# Patient Record
Sex: Male | Born: 1970 | Race: White | Hispanic: No | Smoking: Current every day smoker
Health system: Southern US, Community
[De-identification: ages and names within clinical notes are randomized; demographics above are authoritative.]

## PROBLEM LIST (undated history)

## (undated) DIAGNOSIS — I10 Essential (primary) hypertension: Secondary | ICD-10-CM

## (undated) DIAGNOSIS — F32A Depression, unspecified: Secondary | ICD-10-CM

## (undated) DIAGNOSIS — F419 Anxiety disorder, unspecified: Secondary | ICD-10-CM

## (undated) DIAGNOSIS — F329 Major depressive disorder, single episode, unspecified: Secondary | ICD-10-CM

## (undated) HISTORY — DX: Major depressive disorder, single episode, unspecified: F32.9

## (undated) HISTORY — DX: Anxiety disorder, unspecified: F41.9

## (undated) HISTORY — DX: Depression, unspecified: F32.A

## (undated) HISTORY — PX: OTHER SURGICAL HISTORY: SHX169

## (undated) HISTORY — DX: Essential (primary) hypertension: I10

---

## 2010-09-29 ENCOUNTER — Ambulatory Visit: Payer: Self-pay | Admitting: "Endocrinology

## 2011-07-01 ENCOUNTER — Emergency Department: Payer: Self-pay | Admitting: Internal Medicine

## 2013-12-04 ENCOUNTER — Ambulatory Visit: Payer: Self-pay | Admitting: Internal Medicine

## 2013-12-04 VITALS — BP 122/88 | HR 119 | Temp 97.9°F | Resp 16 | Ht 65.0 in | Wt 171.2 lb

## 2013-12-04 DIAGNOSIS — F3289 Other specified depressive episodes: Secondary | ICD-10-CM

## 2013-12-04 DIAGNOSIS — F329 Major depressive disorder, single episode, unspecified: Secondary | ICD-10-CM

## 2013-12-04 DIAGNOSIS — F32A Depression, unspecified: Secondary | ICD-10-CM

## 2013-12-04 DIAGNOSIS — F1020 Alcohol dependence, uncomplicated: Secondary | ICD-10-CM | POA: Insufficient documentation

## 2013-12-04 MED ORDER — CITALOPRAM HYDROBROMIDE 20 MG PO TABS: 20.0000 mg | ORAL_TABLET | Freq: Every day | ORAL | Status: DC

## 2013-12-04 MED ORDER — TRAZODONE HCL 50 MG PO TABS: 25.0000 mg | ORAL_TABLET | Freq: Every evening | ORAL | Status: DC | PRN

## 2013-12-04 MED ORDER — CYCLOBENZAPRINE HCL 5 MG PO TABS: 5.0000 mg | ORAL_TABLET | Freq: Three times a day (TID) | ORAL | Status: DC | PRN

## 2013-12-04 MED ORDER — CHLORDIAZEPOXIDE HCL 10 MG PO CAPS: 10.0000 mg | ORAL_CAPSULE | Freq: Three times a day (TID) | ORAL | Status: DC | PRN
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
## 2013-12-04 NOTE — Patient Instructions (Addendum)
ETOH and drug services -  [address] - [phone] [address] [place] - [phone] or [phone] Det[facility] - [phone]   [facility] - for counseling [phone]   Start Celexa daily to help with depression Start Flexeril - it will help with your muscle spasms - take this if you need it for muscle spasms and only during the day if you are not sleepy Trazodone - start with 25mg  (1/2 pill) at night and you can increase every 3 days to allow you to sleep up to 100mg  at night (that is 2 pills) Librium 10mg  - start with 3x/day for 5 days and then decrease to 2/day if your symptoms are good Start a Vit b complex vitamin

## 2013-12-05 LAB — COMPLETE METABOLIC PANEL WITH GFR
ALBUMIN: 4.5 g/dL (ref 3.5–5.2)
ALK PHOS: 83 U/L (ref 39–117)
ALT: 21 U/L (ref 0–53)
AST: 23 U/L (ref 0–37)
BUN: 10 mg/dL (ref 6–23)
CO2: 29 mEq/L (ref 19–32)
Calcium: 9.9 mg/dL (ref 8.4–10.5)
Chloride: 105 mEq/L (ref 96–112)
Creat: 0.88 mg/dL (ref 0.50–1.35)
GLUCOSE: 101 mg/dL — AB (ref 70–99)
POTASSIUM: 4.9 meq/L (ref 3.5–5.3)
Sodium: 140 mEq/L (ref 135–145)
Total Bilirubin: 0.4 mg/dL (ref 0.2–1.2)
Total Protein: 7.2 g/dL (ref 6.0–8.3)

## 2013-12-07 ENCOUNTER — Encounter: Payer: Self-pay | Admitting: *Deleted

## 2014-01-08 ENCOUNTER — Emergency Department (HOSPITAL_COMMUNITY)
Admission: EM | Admit: 2014-01-08 | Discharge: 2014-01-08 | Discharge status: Against Medical Advice | Payer: Self-pay | Attending: Emergency Medicine | Admitting: Emergency Medicine

## 2014-01-08 ENCOUNTER — Encounter (HOSPITAL_COMMUNITY): Payer: Self-pay | Admitting: Emergency Medicine

## 2014-01-08 ENCOUNTER — Emergency Department (HOSPITAL_COMMUNITY): Payer: Self-pay

## 2014-01-08 ENCOUNTER — Emergency Department (HOSPITAL_COMMUNITY): Admission: EM | Admit: 2014-01-08 | Discharge: 2014-01-08 | Discharge status: Against Medical Advice | Payer: Self-pay

## 2014-01-08 DIAGNOSIS — M25522 Pain in left elbow: Secondary | ICD-10-CM

## 2014-01-08 DIAGNOSIS — I10 Essential (primary) hypertension: Secondary | ICD-10-CM | POA: Insufficient documentation

## 2014-01-08 DIAGNOSIS — F17200 Nicotine dependence, unspecified, uncomplicated: Secondary | ICD-10-CM | POA: Insufficient documentation

## 2014-01-08 DIAGNOSIS — M25529 Pain in unspecified elbow: Secondary | ICD-10-CM | POA: Insufficient documentation

## 2014-01-08 NOTE — ED Notes (Signed)
Patient states "Im not waiting any longer, Im leaving"

## 2014-01-08 NOTE — ED Notes (Signed)
Pt states elbow pain on left for a couple of days.  Worse this morning. Swelling noted.

## 2014-01-08 NOTE — ED Provider Notes (Signed)
Patient left without being seen after triage. This Janet Humphreys did not interview or assess the patient. This Ahkeem Goede did not partake in any medical decision making for the patient.   Raymon MuttonMarissa Sciacca, PA-C 01/08/14 2103

## 2014-01-09 ENCOUNTER — Ambulatory Visit (INDEPENDENT_AMBULATORY_CARE_PROVIDER_SITE_OTHER): Payer: Self-pay | Admitting: Family Medicine

## 2014-01-09 VITALS — BP 120/80 | HR 106 | Temp 98.2°F | Resp 16 | Ht 65.75 in | Wt 168.4 lb

## 2014-01-09 DIAGNOSIS — M25522 Pain in left elbow: Secondary | ICD-10-CM

## 2014-01-09 DIAGNOSIS — F3289 Other specified depressive episodes: Secondary | ICD-10-CM

## 2014-01-09 DIAGNOSIS — M7022 Olecranon bursitis, left elbow: Secondary | ICD-10-CM

## 2014-01-09 DIAGNOSIS — M25529 Pain in unspecified elbow: Secondary | ICD-10-CM

## 2014-01-09 DIAGNOSIS — F329 Major depressive disorder, single episode, unspecified: Secondary | ICD-10-CM

## 2014-01-09 DIAGNOSIS — M7020 Olecranon bursitis, unspecified elbow: Secondary | ICD-10-CM

## 2014-01-09 DIAGNOSIS — F32A Depression, unspecified: Secondary | ICD-10-CM

## 2014-01-09 MED ORDER — CITALOPRAM HYDROBROMIDE 20 MG PO TABS: 20.0000 mg | ORAL_TABLET | Freq: Every day | ORAL | Status: DC

## 2014-01-09 MED ORDER — TRAMADOL HCL 50 MG PO TABS
50.0000 mg | ORAL_TABLET | Freq: Two times a day (BID) | ORAL | Status: DC | PRN
Start: 2014-01-09 — End: 2014-01-22

## 2014-01-09 NOTE — Patient Instructions (Signed)
Olecranon Bursitis Bursitis is swelling and soreness (inflammation) of a fluid-filled sac (bursa) that covers and protects a joint. Olecranon bursitis occurs over the elbow.  CAUSES Bursitis can be caused by injury, overuse of the joint, arthritis, or infection.  SYMPTOMS   Tenderness, swelling, warmth, or redness over the elbow.  Elbow pain with movement. This is greater with bending the elbow.  Squeaking sound when the bursa is rubbed or moved.  Increasing size of the bursa without pain or discomfort.  Fever with increasing pain and swelling if the bursa becomes infected. HOME CARE INSTRUCTIONS   Put ice on the affected area.  Put ice in a plastic bag.  Place a towel between your skin and the bag.  Leave the ice on for 15-20 minutes each hour while awake. Do this for the first 2 days.  When resting, elevate your elbow above the level of your heart. This helps reduce swelling.  Continue to put the joint through a full range of motion 4 times per day. Rest the injured joint at other times. When the pain lessens, begin normal slow movements and usual activities.  Only take over-the-counter or prescription medicines for pain, discomfort, or fever as directed by your caregiver.  Reduce your intake of milk and related dairy products (cheese, yogurt). They may make your condition worse. SEEK IMMEDIATE MEDICAL CARE IF:   Your pain increases even during treatment.  You have a fever.  You have heat and inflammation over the bursa and elbow.  You have a red line that goes up your arm.  You have pain with movement of your elbow. MAKE SURE YOU:   Understand these instructions.  Will watch your condition.  Will get help right away if you are not doing well or get worse. Document Released: 07/29/2006 Document Revised: 09/21/2011 Document Reviewed: 06/14/2007 ExitCare Patient Information 2015 [employer]. This information is not intended to replace advice given to you by your  health care provider. Make sure you discuss any questions you have with your health care provider.  
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
## 2014-01-20 ENCOUNTER — Other Ambulatory Visit: Payer: Self-pay | Admitting: Physician Assistant

## 2014-01-22 ENCOUNTER — Other Ambulatory Visit: Payer: Self-pay | Admitting: Family Medicine

## 2014-01-22 MED ORDER — TRAMADOL HCL 50 MG PO TABS: 50.0000 mg | ORAL_TABLET | Freq: Two times a day (BID) | ORAL | Status: DC | PRN

## 2014-01-22 NOTE — Telephone Encounter (Signed)
Faxed

## 2014-02-09 ENCOUNTER — Encounter (HOSPITAL_COMMUNITY): Payer: Self-pay | Admitting: Emergency Medicine

## 2014-02-09 ENCOUNTER — Emergency Department (HOSPITAL_COMMUNITY)
Admission: EM | Admit: 2014-02-09 | Discharge: 2014-02-09 | Disposition: A | Discharge status: Home / Self Care | Payer: Self-pay | Attending: Emergency Medicine | Admitting: Emergency Medicine

## 2014-02-09 ENCOUNTER — Emergency Department (HOSPITAL_COMMUNITY): Payer: Self-pay

## 2014-02-09 DIAGNOSIS — F3289 Other specified depressive episodes: Secondary | ICD-10-CM | POA: Insufficient documentation

## 2014-02-09 DIAGNOSIS — Y92838 Other recreation area as the place of occurrence of the external cause: Secondary | ICD-10-CM

## 2014-02-09 DIAGNOSIS — Y9239 Other specified sports and athletic area as the place of occurrence of the external cause: Secondary | ICD-10-CM | POA: Insufficient documentation

## 2014-02-09 DIAGNOSIS — W219XXA Striking against or struck by unspecified sports equipment, initial encounter: Secondary | ICD-10-CM | POA: Insufficient documentation

## 2014-02-09 DIAGNOSIS — Z880 Allergy status to penicillin: Secondary | ICD-10-CM | POA: Insufficient documentation

## 2014-02-09 DIAGNOSIS — S298XXA Other specified injuries of thorax, initial encounter: Secondary | ICD-10-CM | POA: Insufficient documentation

## 2014-02-09 DIAGNOSIS — F411 Generalized anxiety disorder: Secondary | ICD-10-CM | POA: Insufficient documentation

## 2014-02-09 DIAGNOSIS — Z79899 Other long term (current) drug therapy: Secondary | ICD-10-CM | POA: Insufficient documentation

## 2014-02-09 DIAGNOSIS — Y9361 Activity, american tackle football: Secondary | ICD-10-CM | POA: Insufficient documentation

## 2014-02-09 DIAGNOSIS — F17200 Nicotine dependence, unspecified, uncomplicated: Secondary | ICD-10-CM | POA: Insufficient documentation

## 2014-02-09 DIAGNOSIS — F329 Major depressive disorder, single episode, unspecified: Secondary | ICD-10-CM | POA: Insufficient documentation

## 2014-02-09 DIAGNOSIS — R000 Tachycardia, unspecified: Secondary | ICD-10-CM | POA: Insufficient documentation

## 2014-02-09 DIAGNOSIS — S46909A Unspecified injury of unspecified muscle, fascia and tendon at shoulder and upper arm level, unspecified arm, initial encounter: Secondary | ICD-10-CM | POA: Insufficient documentation

## 2014-02-09 DIAGNOSIS — IMO0002 Reserved for concepts with insufficient information to code with codable children: Secondary | ICD-10-CM | POA: Insufficient documentation

## 2014-02-09 DIAGNOSIS — S4980XA Other specified injuries of shoulder and upper arm, unspecified arm, initial encounter: Secondary | ICD-10-CM | POA: Insufficient documentation

## 2014-02-09 DIAGNOSIS — I10 Essential (primary) hypertension: Secondary | ICD-10-CM | POA: Insufficient documentation

## 2014-02-09 DIAGNOSIS — S43401A Unspecified sprain of right shoulder joint, initial encounter: Secondary | ICD-10-CM

## 2014-02-09 MED ORDER — NAPROXEN 500 MG PO TABS: 500.0000 mg | ORAL_TABLET | Freq: Two times a day (BID) | ORAL | Status: DC

## 2014-02-09 MED ORDER — HYDROCODONE-ACETAMINOPHEN 5-325 MG PO TABS
2.0000 | ORAL_TABLET | Freq: Once | ORAL | Status: AC
  Administered 2014-02-09: 2 via ORAL
  Filled 2014-02-09: qty 2

## 2014-02-09 MED ORDER — TRAMADOL HCL 50 MG PO TABS
50.0000 mg | ORAL_TABLET | Freq: Four times a day (QID) | ORAL | Status: DC | PRN
Start: 2014-02-09 — End: 2018-12-09

## 2014-02-09 NOTE — ED Provider Notes (Signed)
Patient signed out to me at shift change. Patient here with a right shoulder injury 2 days ago while playing football. Patient states he fell onto the shoulder. He admits to injuring it a few years ago, has never been evaluated for it in the past. Patient has pain with range of motion of the shoulder. X-ray of the shoulder were obtained and is negative. Patient is also tender over his right anterior chest, chest x-ray was added. It is pending. Patient is medicated with Norco.   10:11 PMX-ray of the chest showing mild lateral right basilar opacity, most likely atelectasis. Patient denies shortness of breath or cough. His pain is clearly musculoskeletal. I will place him in a sling, ice, rest, NSAIDs at home, Ultram for severe pain. Followup with orthopedics doctor or primary care doctor.   Filed Vitals:   02/09/14 1905  BP: 117/78  Pulse: 115  Temp: 98.5 F (36.9 C)  Resp: 18  Weight: 172 lb 3 oz (78.104 kg)  SpO2: 97%        Lottie Musselatyana A Josph Norfleet, PA-C 02/09/14 2212

## 2014-02-09 NOTE — Discharge Instructions (Signed)
Ice your shoulder several times a day. Keep in sling. Follow up with your primary care doctor.   Shoulder Pain The shoulder is the joint that connects your arms to your body. The bones that form the shoulder joint include the upper arm bone (humerus), the shoulder blade (scapula), and the collarbone (clavicle). The top of the humerus is shaped like a ball and fits into a rather flat socket on the scapula (glenoid cavity). A combination of muscles and strong, fibrous tissues that connect muscles to bones (tendons) support your shoulder joint and hold the ball in the socket. Small, fluid-filled sacs (bursae) are located in different areas of the joint. They act as cushions between the bones and the overlying soft tissues and help reduce friction between the gliding tendons and the bone as you move your arm. Your shoulder joint allows a wide range of motion in your arm. This range of motion allows you to do things like scratch your back or throw a ball. However, this range of motion also makes your shoulder more prone to pain from overuse and injury. Causes of shoulder pain can originate from both injury and overuse and usually can be grouped in the following four categories:  Redness, swelling, and pain (inflammation) of the tendon (tendinitis) or the bursae (bursitis).  Instability, such as a dislocation of the joint.  Inflammation of the joint (arthritis).  Broken bone (fracture). HOME CARE INSTRUCTIONS   Apply ice to the sore area.  Put ice in a plastic bag.  Place a towel between your skin and the bag.  Leave the ice on for 15-20 minutes, 3-4 times per day for the first 2 days, or as directed by your health care provider.  Stop using cold packs if they do not help with the pain.  If you have a shoulder sling or immobilizer, wear it as long as your caregiver instructs. Only remove it to shower or bathe. Move your arm as little as possible, but keep your hand moving to prevent  swelling.  Squeeze a soft ball or foam pad as much as possible to help prevent swelling.  Only take over-the-counter or prescription medicines for pain, discomfort, or fever as directed by your caregiver. SEEK MEDICAL CARE IF:   Your shoulder pain increases, or new pain develops in your arm, hand, or fingers.  Your hand or fingers become cold and numb.  Your pain is not relieved with medicines. SEEK IMMEDIATE MEDICAL CARE IF:   Your arm, hand, or fingers are numb or tingling.  Your arm, hand, or fingers are significantly swollen or turn white or blue. MAKE SURE YOU:   Understand these instructions.  Will watch your condition.  Will get help right away if you are not doing well or get worse. Document Released: 04/08/2005 Document Revised: 11/13/2013 Document Reviewed: 06/13/2011 [facility] Patient Information 2015 [place], [place]. This information is not intended to replace advice given to you by your health care provider. Make sure you discuss any questions you have with your health care provider.

## 2014-02-09 NOTE — ED Notes (Signed)
Pt in stating he injured his shoulder while playing football a few days ago, increased pain with movement, CMS intact

## 2014-02-09 NOTE — ED Notes (Signed)
Patient transported to X-ray 
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
## 2014-02-10 NOTE — ED Provider Notes (Signed)
Medical screening examination/treatment/procedure(s) were performed by non-physician practitioner and as supervising physician I was immediately available for consultation/collaboration.  Flint MelterElliott L Jael Waldorf, MD 02/10/14 [phone]

## 2014-02-10 NOTE — ED Provider Notes (Signed)
Medical screening examination/treatment/procedure(s) were performed by non-physician practitioner and as supervising physician I was immediately available for consultation/collaboration.  Joanie Duprey L Dezirea Mccollister, MD 02/10/14 0011 

## 2016-08-10 ENCOUNTER — Emergency Department (HOSPITAL_COMMUNITY)
Admission: EM | Admit: 2016-08-10 | Discharge: 2016-08-10 | Disposition: A | Discharge status: Home / Self Care | Payer: Self-pay | Attending: Emergency Medicine | Admitting: Emergency Medicine

## 2016-08-10 ENCOUNTER — Encounter (HOSPITAL_COMMUNITY): Payer: Self-pay

## 2016-08-10 DIAGNOSIS — F1910 Other psychoactive substance abuse, uncomplicated: Secondary | ICD-10-CM | POA: Insufficient documentation

## 2016-08-10 DIAGNOSIS — Z79899 Other long term (current) drug therapy: Secondary | ICD-10-CM | POA: Insufficient documentation

## 2016-08-10 DIAGNOSIS — I10 Essential (primary) hypertension: Secondary | ICD-10-CM | POA: Insufficient documentation

## 2016-08-10 DIAGNOSIS — F17210 Nicotine dependence, cigarettes, uncomplicated: Secondary | ICD-10-CM | POA: Insufficient documentation

## 2016-08-10 LAB — COMPREHENSIVE METABOLIC PANEL
ALBUMIN: 4.4 g/dL (ref 3.5–5.0)
ALT: 24 U/L (ref 17–63)
ANION GAP: 6 (ref 5–15)
AST: 18 U/L (ref 15–41)
Alkaline Phosphatase: 68 U/L (ref 38–126)
BILIRUBIN TOTAL: 0.4 mg/dL (ref 0.3–1.2)
BUN: 9 mg/dL (ref 6–20)
CO2: 28 mmol/L (ref 22–32)
Calcium: 8.8 mg/dL — ABNORMAL LOW (ref 8.9–10.3)
Chloride: 101 mmol/L (ref 101–111)
Creatinine, Ser: 0.82 mg/dL (ref 0.61–1.24)
GFR calc Af Amer: >60 mL/min (ref >60)
GFR calc non Af Amer: >60 mL/min (ref >60)
Glucose, Bld: 94 mg/dL (ref 65–99)
Potassium: 4 mmol/L (ref 3.5–5.1)
Sodium: 135 mmol/L (ref 135–145)
Total Protein: 7.6 g/dL (ref 6.5–8.1)

## 2016-08-10 LAB — CBC
HEMATOCRIT: 44.2 % (ref 39.0–52.0)
Hemoglobin: 15 g/dL (ref 13.0–17.0)
MCH: 31.5 pg (ref 26.0–34.0)
MCHC: 33.9 g/dL (ref 30.0–36.0)
MCV: 92.9 fL (ref 78.0–100.0)
Platelets: 231 10*3/uL (ref 150–400)
RBC: 4.76 MIL/uL (ref 4.22–5.81)
RDW: 12.8 % (ref 11.5–15.5)
WBC: 9.5 10*3/uL (ref 4.0–10.5)

## 2016-08-10 LAB — RAPID URINE DRUG SCREEN, HOSP PERFORMED
AMPHETAMINES: NOT DETECTED
BARBITURATES: NOT DETECTED
BENZODIAZEPINES: NOT DETECTED
Cocaine: POSITIVE — AB
OPIATES: NOT DETECTED
Tetrahydrocannabinol: POSITIVE — AB

## 2016-08-10 LAB — ETHANOL: Alcohol, Ethyl (B): <5 mg/dL (ref <5)

## 2016-08-10 MED ORDER — LORAZEPAM 1 MG PO TABS
1.0000 mg | ORAL_TABLET | Freq: Once | ORAL | Status: AC
  Administered 2016-08-10: 1 mg via ORAL
  Filled 2016-08-10: qty 1

## 2016-08-10 NOTE — ED Notes (Signed)
Per Charge pt can wait in waiting area, he has money for [facility] in the AM to [facility]. Pt given a meal tray and snacks. Pt is understand, waiting area full of people needing to be seen.

## 2016-08-10 NOTE — ED Notes (Signed)
Pt does not have a ride, [facility]. Pt had a room, but did not get medically cleared in time. Pt states [facility] able [facility] get him another room tomorrow, but he has no where to go, he is homeless

## 2016-08-10 NOTE — ED Triage Notes (Signed)
[facility] and told he needed to come to ED for medical clearance.  Patient reports of drinking and smoking marijuana and cocaine this weekend.  States he wants to detox to go to rehab. Denies SI/HI. Reports of feeling "shakes" at this time.

## 2016-08-10 NOTE — ED Notes (Signed)
Advised patient we needed a urine specimen 

## 2016-08-10 NOTE — ED Notes (Signed)
Pt called from waiting room. Pt not unavailable and not in waiting room

## 2016-08-10 NOTE — ED Provider Notes (Signed)
[facility]-EMERGENCY DEPT Provider Note   CSN: [id number] Arrival date & time: 08/10/16  1317     History   Chief Complaint Chief Complaint  Patient presents with  . Medical Clearance    HPI GAELAN GLENNON is a 46 y.o. male.  HPI  46 year old male here for medical clearance. He states that he has been drinking alcohol, using cocaine, and smoking weed. He has not used any today. He decided that he wanted to go to rehabilitation and went to day [facility] today. He states that told him he needed to be medically cleared. He states at that time the head of bed for him. I told him his blood pressure was mildly elevated. He has no physical complaints. He states that he has been homeless recently. He has a restraining order taken out by his wife. He has not been working recently. He denies symptoms of depression, homicidal ideation, suicidal ideation, or psychosis. He states that he would like to go to detox though because he has not been able to find a place to stay and does not want to keep drinking. He states he has been through detox before. He states he had some shaking but did not require any medication for his alcohol abuse.  Past Medical History:  Diagnosis Date  . Anxiety   . Depression   . Hypertension     Patient Active Problem List   Diagnosis Date Noted  . EtOH dependence (HCC) 12/04/2013    Past Surgical History:  Procedure Laterality Date  . Steel plate put in Frontal lob         Home Medications    Prior to Admission medications   Medication Sig Start Date End Date Taking? Authorizing Provider  citalopram (CELEXA) 20 MG tablet Take 1 tablet (20 mg total) by mouth daily. 01/09/14   Thao P Le, DO  naproxen (NAPROSYN) 500 MG tablet Take 1 tablet (500 mg total) by mouth 2 (two) times daily. 02/09/14   Tatyana Kirichenko, PA-C  traMADol (ULTRAM) 50 MG tablet Take 1 tablet (50 mg total) by mouth every 6 (six) hours as needed. 02/09/14   Tatyana Kirichenko, PA-C  traZODone  (DESYREL) 50 MG tablet Take 0.5-2 tablets (25-100 mg total) by mouth at bedtime as needed for sleep. 12/04/13   Morrell Riddle, PA-C    Family History Family History  Problem Relation Age of Onset  . Hypertension Father   . Hypertension Sister   . Hypertension Brother     Social History Social History  Substance Use Topics  . Smoking status: Current Every Day Smoker    Packs/day: 1.00    Types: Cigarettes  . Smokeless tobacco: Former Neurosurgeon  . Alcohol use Yes     Comment: 160 oz of beer daily     Allergies   Penicillins   Review of Systems Review of Systems  All other systems reviewed and are negative.    Physical Exam Updated Vital Signs BP 132/93 (BP Location: Left Arm)   Pulse 86   Temp 98.5 F (36.9 C) (Oral)   Resp 18   Ht 5\' 6"  (1.676 m)   Wt 68 kg   SpO2 97%   BMI 24.21 kg/m   Physical Exam  Constitutional: He is oriented to person, place, and time. He appears well-developed and well-nourished.  HENT:  Head: Normocephalic and atraumatic.  Right Ear: External ear normal.  Left Ear: External ear normal.  Nose: Nose normal.  Mouth/Throat: Oropharynx is clear and moist.  Eyes: Conjunctivae and EOM are normal. Pupils are equal, round, and reactive to light.  Neck: Normal range of motion. Neck supple.  Cardiovascular: Normal rate, regular rhythm, normal heart sounds and intact distal pulses.   Pulmonary/Chest: Effort normal and breath sounds normal.  Abdominal: Soft. Bowel sounds are normal.  Musculoskeletal: Normal range of motion.  Neurological: He is alert and oriented to person, place, and time. He has normal reflexes.  Skin: Skin is warm and dry.  Psychiatric: He has a normal mood and affect. His behavior is normal. Judgment and thought content normal.  Nursing note and vitals reviewed.    ED Treatments / Results  Labs (all labs ordered are listed, but only abnormal results are displayed) Labs Reviewed  COMPREHENSIVE METABOLIC PANEL - Abnormal;  Notable for the following:       Result Value   Calcium 8.8 (*)    All other components within normal limits  ETHANOL  CBC  RAPID URINE DRUG SCREEN, HOSP PERFORMED    EKG  EKG Interpretation None       Radiology No results found.  Procedures Procedures (including critical care time)  Medications Ordered in ED Medications - No data to display   Initial Impression / Assessment and Plan / ED Course  I have reviewed the triage vital signs and the nursing notes.  Pertinent labs & imaging results that were available during my care of the patient were reviewed by me and considered in my medical decision making (see chart for details).     Patient is medically cleared here. He has mild hypertension with blood pressure 139/98. He is not tachycardic is shaking. This is likely essential hypertension. Plan 1 mg Ativan pending locating bed for patient Nursing is looking into if there is bed available for him.  Currently no beds available for patient.  Patient advised. We are assisting him in finding a place to go. Final Clinical Impressions(s) / ED Diagnoses   Final diagnoses:  Polysubstance abuse    New Prescriptions New Prescriptions   No medications on file     Margarita Grizzleanielle Marshay Slates, MD 08/10/16 1919

## 2017-08-13 NOTE — Congregational Nurse Program (Unsigned)
Congregational Nurse Program Note  Date of Encounter: 08/03/2017  Past Medical History: Past Medical History:  Diagnosis Date  . Anxiety   . Depression   . Hypertension     Encounter Details: CNP Questionnaire - 08/03/17 1703      Questionnaire   Patient Status  Not Applicable    Race  White or Caucasian    Location Patient Served At  [place] [address], [place]    Uninsured  Uninsured (NEW 1x/quarter)    Food  No food insecurities    Housing/Utilities  No permanent housing    Transportation  No transportation needs    Economistnterpersonal Safety  Yes, feel physically and emotionally safe where you currently live    Medication  No medication insecurities    Medical Provider  No    Referrals  Dental    ED Visit Averted  Not Applicable    Life-Saving Intervention Made  Not Applicable     Mr Colins co pain in right upper molar.Stated he needed to be seen at the dentist.Explained that [facility] Saturday January 26. He needs to be in line between 5am and 6am. No paperwork needed. BP 144/86 HR 96 [place] [facility] [phone]

## 2017-11-17 NOTE — Congregational Nurse Program (Signed)
Congregational Nurse Program Note  Date of Encounter: 08/13/2017  Past Medical History: Past Medical History:  Diagnosis Date  . Anxiety   . Depression   . Hypertension     Encounter Details: Wants to be seen at the dentist for fillings and hopefully no extractions. Told about the dental bus that will be coming to the  [facility] Saturday  B/P  144/86- P 96 Reviewed the importance of Taking his meds, eating the proper diet and Getting exercise. Jenene Slicker RN, [facility],  [phone]   Rescue

## 2018-12-07 ENCOUNTER — Other Ambulatory Visit: Payer: Self-pay

## 2018-12-07 ENCOUNTER — Emergency Department (HOSPITAL_COMMUNITY): Payer: Self-pay

## 2018-12-07 ENCOUNTER — Encounter (HOSPITAL_COMMUNITY): Payer: Self-pay | Admitting: Emergency Medicine

## 2018-12-07 ENCOUNTER — Inpatient Hospital Stay (HOSPITAL_COMMUNITY)
Admission: EM | Admit: 2018-12-07 | Discharge: 2018-12-09 | DRG: 292 | Disposition: A | Discharge status: Home / Self Care | Payer: Self-pay | Attending: Family Medicine | Admitting: Family Medicine

## 2018-12-07 DIAGNOSIS — R911 Solitary pulmonary nodule: Secondary | ICD-10-CM | POA: Diagnosis present

## 2018-12-07 DIAGNOSIS — I5021 Acute systolic (congestive) heart failure: Secondary | ICD-10-CM | POA: Diagnosis present

## 2018-12-07 DIAGNOSIS — F1020 Alcohol dependence, uncomplicated: Secondary | ICD-10-CM

## 2018-12-07 DIAGNOSIS — F419 Anxiety disorder, unspecified: Secondary | ICD-10-CM | POA: Diagnosis present

## 2018-12-07 DIAGNOSIS — Z79899 Other long term (current) drug therapy: Secondary | ICD-10-CM

## 2018-12-07 DIAGNOSIS — R188 Other ascites: Secondary | ICD-10-CM | POA: Diagnosis present

## 2018-12-07 DIAGNOSIS — F329 Major depressive disorder, single episode, unspecified: Secondary | ICD-10-CM | POA: Diagnosis present

## 2018-12-07 DIAGNOSIS — R601 Generalized edema: Secondary | ICD-10-CM | POA: Diagnosis present

## 2018-12-07 DIAGNOSIS — R9431 Abnormal electrocardiogram [ECG] [EKG]: Secondary | ICD-10-CM | POA: Diagnosis present

## 2018-12-07 DIAGNOSIS — I509 Heart failure, unspecified: Secondary | ICD-10-CM

## 2018-12-07 DIAGNOSIS — I110 Hypertensive heart disease with heart failure: Principal | ICD-10-CM | POA: Diagnosis present

## 2018-12-07 DIAGNOSIS — Z791 Long term (current) use of non-steroidal anti-inflammatories (NSAID): Secondary | ICD-10-CM

## 2018-12-07 DIAGNOSIS — Z8249 Family history of ischemic heart disease and other diseases of the circulatory system: Secondary | ICD-10-CM

## 2018-12-07 DIAGNOSIS — Z79891 Long term (current) use of opiate analgesic: Secondary | ICD-10-CM

## 2018-12-07 DIAGNOSIS — Z880 Allergy status to penicillin: Secondary | ICD-10-CM

## 2018-12-07 DIAGNOSIS — Z1159 Encounter for screening for other viral diseases: Secondary | ICD-10-CM

## 2018-12-07 DIAGNOSIS — F17210 Nicotine dependence, cigarettes, uncomplicated: Secondary | ICD-10-CM | POA: Diagnosis present

## 2018-12-07 DIAGNOSIS — I5023 Acute on chronic systolic (congestive) heart failure: Secondary | ICD-10-CM | POA: Diagnosis present

## 2018-12-07 LAB — CBC WITH DIFFERENTIAL/PLATELET
Abs Immature Granulocytes: 0.02 10*3/uL (ref 0.00–0.07)
Basophils Absolute: 0.1 10*3/uL (ref 0.0–0.1)
Basophils Relative: 1 %
Eosinophils Absolute: 0 10*3/uL (ref 0.0–0.5)
Eosinophils Relative: 0 %
HCT: 42.8 % (ref 39.0–52.0)
Hemoglobin: 13.6 g/dL (ref 13.0–17.0)
Immature Granulocytes: 0 %
Lymphocytes Relative: 30 %
Lymphs Abs: 3 10*3/uL (ref 0.7–4.0)
MCH: 28.5 pg (ref 26.0–34.0)
MCHC: 31.8 g/dL (ref 30.0–36.0)
MCV: 89.5 fL (ref 80.0–100.0)
Monocytes Absolute: 0.9 10*3/uL (ref 0.1–1.0)
Monocytes Relative: 10 %
Neutro Abs: 5.8 10*3/uL (ref 1.7–7.7)
Neutrophils Relative %: 59 %
Platelets: 358 10*3/uL (ref 150–400)
RBC: 4.78 MIL/uL (ref 4.22–5.81)
RDW: 15.5 % (ref 11.5–15.5)
WBC: 9.8 10*3/uL (ref 4.0–10.5)
nRBC: 0 % (ref 0.0–0.2)

## 2018-12-07 LAB — COMPREHENSIVE METABOLIC PANEL
ALT: 157 U/L — ABNORMAL HIGH (ref 0–44)
AST: 75 U/L — ABNORMAL HIGH (ref 15–41)
Albumin: 3.5 g/dL (ref 3.5–5.0)
Alkaline Phosphatase: 91 U/L (ref 38–126)
Anion gap: 12 (ref 5–15)
BUN: 23 mg/dL — ABNORMAL HIGH (ref 6–20)
CO2: 23 mmol/L (ref 22–32)
Calcium: 8.4 mg/dL — ABNORMAL LOW (ref 8.9–10.3)
Chloride: 101 mmol/L (ref 98–111)
Creatinine, Ser: 0.95 mg/dL (ref 0.61–1.24)
GFR calc Af Amer: >60 mL/min (ref >60)
GFR calc non Af Amer: >60 mL/min (ref >60)
Glucose, Bld: 124 mg/dL — ABNORMAL HIGH (ref 70–99)
Potassium: 4.5 mmol/L (ref 3.5–5.1)
Sodium: 136 mmol/L (ref 135–145)
Total Bilirubin: 1.7 mg/dL — ABNORMAL HIGH (ref 0.3–1.2)
Total Protein: 8 g/dL (ref 6.5–8.1)

## 2018-12-07 LAB — TROPONIN I: Troponin I: 0.03 ng/mL (ref <0.03)

## 2018-12-07 LAB — D-DIMER, QUANTITATIVE: D-Dimer, Quant: 3.07 ug/mL-FEU — ABNORMAL HIGH (ref 0.00–0.50)

## 2018-12-07 LAB — BRAIN NATRIURETIC PEPTIDE: B Natriuretic Peptide: 2422 pg/mL — ABNORMAL HIGH (ref 0.0–100.0)

## 2018-12-07 LAB — LIPASE, BLOOD: Lipase: 28 U/L (ref 11–51)

## 2018-12-07 MED ORDER — IOHEXOL 300 MG/ML  SOLN: 100.0000 mL | Freq: Once | INTRAMUSCULAR | Status: DC | PRN

## 2018-12-07 MED ORDER — IOHEXOL 350 MG/ML SOLN
100.0000 mL | Freq: Once | INTRAVENOUS | Status: AC | PRN
  Administered 2018-12-07: 100 mL via INTRAVENOUS

## 2018-12-07 MED ORDER — FUROSEMIDE 10 MG/ML IJ SOLN
40.0000 mg | Freq: Once | INTRAMUSCULAR | Status: AC
  Administered 2018-12-07: 40 mg via INTRAVENOUS
  Filled 2018-12-07: qty 4

## 2018-12-07 NOTE — ED Notes (Signed)
CRITICAL VALUE ALERT  Critical Value:  Trop 0.03  Date & Time Notied:  12/07/2018 2313  Provider Notified: Dr. Adriana Simas  Orders Received/Actions taken: See chart

## 2018-12-07 NOTE — ED Triage Notes (Signed)
Pt c/o bilateral leg swelling and sob x 3 days. Pt states he is out of all home meds x 4 days.

## 2018-12-07 NOTE — ED Provider Notes (Signed)
[facility] EMERGENCY DEPARTMENT Provider Note   CSN: [id number] Arrival date & time: 12/07/18  2112    History   Chief Complaint Chief Complaint  Patient presents with  . Leg Swelling    HPI CLIFFTON SWING is a 48 y.o. male.     HPI   TAG WOITAS is a 48 y.o. male who presents to the Emergency Department complaining of increasing shortness of breath, right sided abdominal pain and swelling of the bilateral lower extremities.  He states that he was admitted to [facility] approximately one month ago for PNA and right pleural effusion.  He was d/c home on Lasix, Levaquin, metoprolol, and lisinopril.  Pt states that he has not been able to establish a PCP and ran out of his medications 4 days ago.  Since that time he endorses increasing shortness of breath and swelling to both legs.  He admits to a significant hx of alcohol abuse, but states he has not drank alcohol since his hospital admission.  He denies chest pain, fever, cough, vomiting or diarrhea.  States he tested neg for COVID    Past Medical History:  Diagnosis Date  . Anxiety   . Depression   . Hypertension     Patient Active Problem List   Diagnosis Date Noted  . EtOH dependence (HCC) 12/04/2013    Past Surgical History:  Procedure Laterality Date  . Steel plate put in Frontal lob        Home Medications    Prior to Admission medications   Medication Sig Start Date End Date Taking? Authorizing Provider  citalopram (CELEXA) 20 MG tablet Take 1 tablet (20 mg total) by mouth daily. 01/09/14   Le, Thao P, DO  naproxen (NAPROSYN) 500 MG tablet Take 1 tablet (500 mg total) by mouth 2 (two) times daily. 02/09/14   Kirichenko, Tatyana, PA-C  traMADol (ULTRAM) 50 MG tablet Take 1 tablet (50 mg total) by mouth every 6 (six) hours as needed. 02/09/14   Kirichenko, Lemont Fillers, PA-C  traZODone (DESYREL) 50 MG tablet Take 0.5-2 tablets (25-100 mg total) by mouth at bedtime as needed for sleep. 12/04/13   Valarie Cones, Dema Severin, PA-C    Family History Family History  Problem Relation Age of Onset  . Hypertension Father   . Hypertension Sister   . Hypertension Brother     Social History Social History   Tobacco Use  . Smoking status: Current Every Day Smoker    Packs/day: 1.00    Types: Cigarettes  . Smokeless tobacco: Former Engineer, water Use Topics  . Alcohol use: Not Currently    Comment: 160 oz of beer daily  . Drug use: Not Currently    Types: Cocaine, Marijuana     Allergies   Penicillins   Review of Systems Review of Systems  Constitutional: Negative for appetite change, chills and fever.  Respiratory: Positive for shortness of breath. Negative for cough.   Cardiovascular: Positive for leg swelling. Negative for chest pain.  Gastrointestinal: Positive for abdominal pain. Negative for blood in stool, diarrhea, nausea and vomiting.  Genitourinary: Negative for difficulty urinating, dysuria and flank pain.  Musculoskeletal: Negative for back pain and neck pain.  Skin: Negative for color change and rash.  Neurological: Negative for dizziness, weakness and numbness.  Hematological: Negative for adenopathy.  Psychiatric/Behavioral: The patient is nervous/anxious.      Physical Exam Updated Vital Signs BP (!) 136/97   Pulse (!) 118   Temp 98.4 F (36.9  C)   Resp 19   Ht 5\' 6"  (1.676 m)   Wt 74.8 kg   SpO2 98%   BMI 26.63 kg/m   Physical Exam Vitals signs and nursing note reviewed.  Constitutional:      Comments: Pt is uncomfortable appearing and anxious  HENT:     Mouth/Throat:     Mouth: Mucous membranes are moist.     Pharynx: Oropharynx is clear.  Eyes:     General: No scleral icterus.    Conjunctiva/sclera: Conjunctivae normal.     Pupils: Pupils are equal, round, and reactive to light.  Neck:     Musculoskeletal: Normal range of motion.  Cardiovascular:     Rate and Rhythm: Regular rhythm. Tachycardia present.     Pulses: Normal pulses.  Pulmonary:      Comments: Few crackles heard at right base.  No respiratory distress noted.   Abdominal:     General: There is distension.     Palpations: Abdomen is soft.     Tenderness: There is abdominal tenderness.     Comments: ttp of the right upper abdomen.   Abdomen is mildly distended.    Musculoskeletal:     Right lower leg: Edema present.     Left lower leg: Edema present.     Comments: 2+ pitting edema of bilateral LE's   Skin:    General: Skin is warm and dry.     Capillary Refill: Capillary refill takes less than 2 seconds.     Findings: No erythema.  Neurological:     General: No focal deficit present.     Mental Status: He is alert and oriented to person, place, and time.     GCS: GCS eye subscore is 4. GCS verbal subscore is 5. GCS motor subscore is 6.     Sensory: Sensation is intact.     Motor: Motor function is intact.      ED Treatments / Results  Labs (all labs ordered are listed, but only abnormal results are displayed) Labs Reviewed  COMPREHENSIVE METABOLIC PANEL - Abnormal; Notable for the following components:      Result Value   Glucose, Bld 124 (*)    BUN 23 (*)    Calcium 8.4 (*)    AST 75 (*)    ALT 157 (*)    Total Bilirubin 1.7 (*)    All other components within normal limits  TROPONIN I - Abnormal; Notable for the following components:   Troponin I 0.03 (*)    All other components within normal limits  BRAIN NATRIURETIC PEPTIDE - Abnormal; Notable for the following components:   B Natriuretic Peptide 2,422.0 (*)    All other components within normal limits  D-DIMER, QUANTITATIVE (NOT AT [facility]) - Abnormal; Notable for the following components:   D-Dimer, Quant 3.07 (*)    All other components within normal limits  SARS CORONAVIRUS 2 (HOSPITAL ORDER, PERFORMED IN [facility])  CBC WITH DIFFERENTIAL/PLATELET  LIPASE, BLOOD  URINALYSIS, ROUTINE W REFLEX MICROSCOPIC    EKG EKG Interpretation  Date/Time:  Wednesday Dec 07 2018 22:14:14 EDT  Ventricular Rate:  126 PR Interval:    QRS Duration: 90 QT Interval:  358 QTC Calculation: 519 R Axis:   108 Text Interpretation:  Sinus or ectopic atrial tachycardia Anterior infarct, old Abnormal T, consider ischemia, diffuse leads Prolonged QT interval Confirmed by Donnetta Hutchingook, Brian ([id number]) on 12/07/2018 10:22:11 PM Also confirmed by Donnetta Hutchingook, Brian ([id number])  on 12/07/2018 10:23:01 PM  Radiology Ct Angio Chest Pe W And/or Wo Contrast  Result Date: 12/08/2018 CLINICAL DATA:  Bilateral leg swelling and shortness of breath EXAM: CT ANGIOGRAPHY CHEST WITH CONTRAST TECHNIQUE: Multidetector CT imaging of the chest was performed using the standard protocol during bolus administration of intravenous contrast. Multiplanar CT image reconstructions and MIPs were obtained to evaluate the vascular anatomy. CONTRAST:  OMNIPAQUE IOHEXOL 350 MG/ML SOLN COMPARISON:  Chest x-ray 12/07/2018 FINDINGS: Cardiovascular: Satisfactory opacification of the pulmonary arteries to the segmental level. No evidence of pulmonary embolism. Nonaneurysmal aorta. Mild cardiomegaly. Trace pericardial effusion. Mediastinum/Nodes: Midline trachea. No thyroid mass. Mild fluid distended esophagus. No 6 adenopathy. Lungs/Pleura: Large right pleural effusion with compressive atelectasis at the right middle and lower lobes. Patchy focus of atelectasis or inflammatory infiltrate at the lingula. Left lower lobe anterior ovoid nodule measuring 2 cm with density values is continue fat containing lesion Upper Abdomen: Small amount of perihepatic ascites. Gas bubbles within the right upper anterior abdomen. Musculoskeletal: No acute or suspicious osseous abnormality Review of the MIP images confirms the above findings. IMPRESSION: 1. Negative for acute pulmonary embolus. 2. Large right-sided pleural effusion compressive atelectasis at the right middle lobe and right lower lobe. Cardiomegaly 3. Small amount of perihepatic ascites. Incompletely visualized  gas bubbles within the right upper quadrant anterior to the liver, uncertain if this represents a small amount of pneumoperitoneum versus gas bubbles in the high-riding loop of colon or bowel. CT abdomen pelvis is suggested to distinguish between the 2 possibilities. 4. 2 cm ovoid fat density nodule in the anterior left lower lobe, possibly a hamartoma Electronically Signed   By: Jasmine Pang M.D.   On: 12/08/2018 00:19   Dg Chest Portable 1 View  Result Date: 12/07/2018 CLINICAL DATA:  Shortness of breath EXAM: PORTABLE CHEST 1 VIEW COMPARISON:  10/25/2018 FINDINGS: Cardiac shadow is enlarged but stable. Resolution of left-sided pleural effusion and atelectasis is noted. New right-sided pleural effusion and atelectasis is seen. No other focal abnormality is noted. IMPRESSION: Right basilar atelectasis and effusion. Electronically Signed   By: Alcide Clever M.D.   On: 12/07/2018 22:30    Procedures Procedures (including critical care time)  Medications Ordered in ED Medications  furosemide (LASIX) injection 40 mg (40 mg Intravenous Given 12/07/18 2306)     Initial Impression / Assessment and Plan / ED Course  I have reviewed the triage vital signs and the nursing notes.  Pertinent labs & imaging results that were available during my care of the patient were reviewed by me and considered in my medical decision making (see chart for details).        Pt also seen by Dr. Adriana Simas   Pt with acute CHF BNP elevated at 2400.  IV lasix given.  D Dimer ordered given pt's unexplained tachycardia also elevated at 3.07.  I have discussed findings with hospitalist, Dr. Sharl Ma and CT angio ordered   0030  CT angio neg for PE, radiology requesting CT abd/pelvis.  Pt findings discussed with Dr. Estell Harpin, who assumes care  Final Clinical Impressions(s) / ED Diagnoses   Final diagnoses:  None    ED Discharge Orders    None       Pauline Aus, PA-C 12/08/18 0035    Donnetta Hutching, MD 12/08/18 [phone]

## 2018-12-08 ENCOUNTER — Encounter (HOSPITAL_COMMUNITY): Payer: Self-pay | Admitting: *Deleted

## 2018-12-08 ENCOUNTER — Emergency Department (HOSPITAL_COMMUNITY): Payer: Self-pay

## 2018-12-08 ENCOUNTER — Inpatient Hospital Stay (HOSPITAL_COMMUNITY): Payer: Self-pay

## 2018-12-08 DIAGNOSIS — I5021 Acute systolic (congestive) heart failure: Secondary | ICD-10-CM

## 2018-12-08 DIAGNOSIS — R601 Generalized edema: Secondary | ICD-10-CM

## 2018-12-08 LAB — URINALYSIS, ROUTINE W REFLEX MICROSCOPIC
Bacteria, UA: NONE SEEN
Bilirubin Urine: NEGATIVE
Glucose, UA: NEGATIVE mg/dL
Hgb urine dipstick: NEGATIVE
Ketones, ur: NEGATIVE mg/dL
Leukocytes,Ua: NEGATIVE
Nitrite: NEGATIVE
Protein, ur: 30 mg/dL — AB
Specific Gravity, Urine: 1.015 (ref 1.005–1.030)
pH: 5 (ref 5.0–8.0)

## 2018-12-08 LAB — BASIC METABOLIC PANEL
Anion gap: 13 (ref 5–15)
BUN: 25 mg/dL — ABNORMAL HIGH (ref 6–20)
CO2: 25 mmol/L (ref 22–32)
Calcium: 8.5 mg/dL — ABNORMAL LOW (ref 8.9–10.3)
Chloride: 99 mmol/L (ref 98–111)
Creatinine, Ser: 1.29 mg/dL — ABNORMAL HIGH (ref 0.61–1.24)
GFR calc Af Amer: >60 mL/min (ref >60)
GFR calc non Af Amer: >60 mL/min (ref >60)
Glucose, Bld: 109 mg/dL — ABNORMAL HIGH (ref 70–99)
Potassium: 4.4 mmol/L (ref 3.5–5.1)
Sodium: 137 mmol/L (ref 135–145)

## 2018-12-08 LAB — SARS CORONAVIRUS 2 BY RT PCR (HOSPITAL ORDER, PERFORMED IN ~~LOC~~ HOSPITAL LAB): SARS Coronavirus 2: NEGATIVE

## 2018-12-08 LAB — CBC
HCT: 43.2 % (ref 39.0–52.0)
Hemoglobin: 13.6 g/dL (ref 13.0–17.0)
MCH: 28.5 pg (ref 26.0–34.0)
MCHC: 31.5 g/dL (ref 30.0–36.0)
MCV: 90.6 fL (ref 80.0–100.0)
Platelets: 367 10*3/uL (ref 150–400)
RBC: 4.77 MIL/uL (ref 4.22–5.81)
RDW: 15.6 % — ABNORMAL HIGH (ref 11.5–15.5)
WBC: 9.5 10*3/uL (ref 4.0–10.5)
nRBC: 0 % (ref 0.0–0.2)

## 2018-12-08 LAB — TROPONIN I
Troponin I: 0.03 ng/mL (ref <0.03)
Troponin I: 0.03 ng/mL (ref <0.03)
Troponin I: 0.03 ng/mL (ref <0.03)

## 2018-12-08 LAB — MRSA PCR SCREENING: MRSA by PCR: NEGATIVE

## 2018-12-08 MED ORDER — SODIUM CHLORIDE 0.9% FLUSH: 3.0000 mL | INTRAVENOUS | Status: DC | PRN

## 2018-12-08 MED ORDER — ONDANSETRON HCL 4 MG/2ML IJ SOLN: 4.0000 mg | Freq: Four times a day (QID) | INTRAMUSCULAR | Status: DC | PRN

## 2018-12-08 MED ORDER — IOHEXOL 300 MG/ML  SOLN
50.0000 mL | Freq: Once | INTRAMUSCULAR | Status: AC | PRN
  Administered 2018-12-08: 50 mL via INTRAVENOUS

## 2018-12-08 MED ORDER — SODIUM CHLORIDE 0.9% FLUSH
3.0000 mL | Freq: Two times a day (BID) | INTRAVENOUS | Status: DC
  Administered 2018-12-08 (×3): 3 mL via INTRAVENOUS

## 2018-12-08 MED ORDER — METOPROLOL TARTRATE 25 MG PO TABS
25.0000 mg | ORAL_TABLET | Freq: Two times a day (BID) | ORAL | Status: DC
  Administered 2018-12-08 (×3): 25 mg via ORAL
  Filled 2018-12-08 (×3): qty 1

## 2018-12-08 MED ORDER — SODIUM CHLORIDE 0.9 % IV SOLN: 250.0000 mL | INTRAVENOUS | Status: DC | PRN

## 2018-12-08 MED ORDER — PNEUMOCOCCAL VAC POLYVALENT 25 MCG/0.5ML IJ INJ: 0.5000 mL | INJECTION | INTRAMUSCULAR | Status: DC

## 2018-12-08 MED ORDER — ONDANSETRON HCL 4 MG PO TABS: 4.0000 mg | ORAL_TABLET | Freq: Four times a day (QID) | ORAL | Status: DC | PRN

## 2018-12-08 MED ORDER — ACETAMINOPHEN 325 MG PO TABS: 650.0000 mg | ORAL_TABLET | Freq: Four times a day (QID) | ORAL | Status: DC | PRN

## 2018-12-08 MED ORDER — ACETAMINOPHEN 650 MG RE SUPP: 650.0000 mg | Freq: Four times a day (QID) | RECTAL | Status: DC | PRN

## 2018-12-08 MED ORDER — LISINOPRIL 5 MG PO TABS: 5.0000 mg | ORAL_TABLET | Freq: Every day | ORAL | Status: DC

## 2018-12-08 MED ORDER — OXYCODONE HCL 5 MG PO TABS
5.0000 mg | ORAL_TABLET | Freq: Four times a day (QID) | ORAL | Status: DC | PRN
  Administered 2018-12-08 – 2018-12-09 (×2): 5 mg via ORAL
  Filled 2018-12-08 (×2): qty 1

## 2018-12-08 MED ORDER — ENOXAPARIN SODIUM 40 MG/0.4ML ~~LOC~~ SOLN
40.0000 mg | SUBCUTANEOUS | Status: DC
  Administered 2018-12-08 – 2018-12-09 (×2): 40 mg via SUBCUTANEOUS
  Filled 2018-12-08 (×2): qty 0.4

## 2018-12-08 MED ORDER — FUROSEMIDE 10 MG/ML IJ SOLN
40.0000 mg | Freq: Two times a day (BID) | INTRAMUSCULAR | Status: DC
  Administered 2018-12-08 (×2): 40 mg via INTRAVENOUS
  Filled 2018-12-08 (×2): qty 4

## 2018-12-08 NOTE — H&P (Addendum)
TRH H&P    Patient Demographics:    Dennis Andrews, is a 48 y.o. male  MRN: [id number]  DOB - 1971/02/05  Admit Date - 12/07/2018  Referring MD/NP/PA: Dr. Estell Harpin  Outpatient Primary MD for the patient is Patient, No Pcp Per  Patient coming from: Home  Chief complaint-leg swelling   HPI:    Dennis Andrews  is a 48 y.o. male, with history of hypertension, alcohol dependence who came to the ED with complaints of bilateral lower extremities swelling.  Patient was recently admitted to [facility] him a month ago for pneumonia and right pleural effusion.  At that time echocardiogram showed EF 25 to 30%, patient was discharged on metoprolol, Lasix, ciprofloxacin, lisinopril.  Patient ran out of these medications 4 days ago and presented to the ED with bilateral lower extremity swelling.  He also complained of some shortness of breath.  He is not requiring oxygen in the ED.  O2 sats 94 to 98% on room air. Patient was given Lasix 40 mg IV in the ED. Lab work showed elevated d-dimer, 3.07, BNP  2422.0 CTA chest was done which was negative for PE.  There was a concern for possible pneumoperitoneum versus gas bubbles in the colon.  CT abdomen pelvis was done which did not show any significant abnormality except for moderate right pleural effusion. SARS 2covid was negative in the ED. He denies chest pain Denies nausea vomiting or diarrhea. Denies fever chills. Patient says that he has not had alcohol since he was admitted at [facility].    Review of systems:    In addition to the HPI above,    All other systems reviewed and are negative.    Past History of the following :    Past Medical History:  Diagnosis Date   Anxiety    Depression    Hypertension       Past Surgical History:  Procedure Laterality Date   Steel plate put in Frontal lob        Social History:      Social History    Tobacco Use   Smoking status: Current Every Day Smoker    Packs/day: 1.00    Types: Cigarettes   Smokeless tobacco: Former Engineer, water Use Topics   Alcohol use: Not Currently    Comment: 160 oz of beer daily       Family History :     Family History  Problem Relation Age of Onset   Hypertension Father    Hypertension Sister    Hypertension Brother       Home Medications:   Prior to Admission medications   Medication Sig Start Date End Date Taking? Authorizing Provider  citalopram (CELEXA) 20 MG tablet Take 1 tablet (20 mg total) by mouth daily. 01/09/14   Le, Thao P, DO  naproxen (NAPROSYN) 500 MG tablet Take 1 tablet (500 mg total) by mouth 2 (two) times daily. 02/09/14   Kirichenko, Tatyana, PA-C  traMADol (ULTRAM) 50 MG tablet Take 1 tablet (50 mg total) by  mouth every 6 (six) hours as needed. 02/09/14   Kirichenko, Lemont Fillers, PA-C  traZODone (DESYREL) 50 MG tablet Take 0.5-2 tablets (25-100 mg total) by mouth at bedtime as needed for sleep. 12/04/13   Valarie Cones, Dema Severin, PA-C     Allergies:     Allergies  Allergen Reactions   Penicillins Hives and Itching     Physical Exam:   Vitals  Blood pressure (!) 124/97, pulse (!) 122, temperature 98.4 F (36.9 C), resp. rate (!) 23, height  (1.676 m), weight 74.8 kg, SpO2 95 %.  1.  General: Appears in no acute distress  2. Psychiatric: Alert, oriented x3, intact insight and judgment  3. Neurologic: Alert, oriented x3, no focal deficit noted  4. HEENMT:  Atraumatic normocephalic, extraocular muscles intact  5. Respiratory : Decreased breath sounds bilaterally, faint crackles at lung bases  6. Cardiovascular : S1-S2, regular, no murmur auscultated, bilateral 1+ pitting edema of the lower extremities  7. Gastrointestinal:  Abdomen is soft, nontender, no organomegaly      Data Review:    CBC Recent Labs  Lab 12/07/18 2210  WBC 9.8  HGB 13.6  HCT 42.8  PLT 358  MCV 89.5  MCH 28.5    MCHC 31.8  RDW 15.5  LYMPHSABS 3.0  MONOABS 0.9  EOSABS 0.0  BASOSABS 0.1   ------------------------------------------------------------------------------------------------------------------  Results for orders placed or performed during the hospital encounter of 12/07/18 (from the past 48 hour(s))  Comprehensive metabolic panel     Status: Abnormal   Collection Time: 12/07/18 10:10 PM  Result Value Ref Range   Sodium 136 135 - 145 mmol/L   Potassium 4.5 3.5 - 5.1 mmol/L   Chloride 101 98 - 111 mmol/L   CO2 23 22 - 32 mmol/L   Glucose, Bld 124 (H) 70 - 99 mg/dL   BUN 23 (H) 6 - 20 mg/dL   Creatinine, Ser 7.82 0.61 - 1.24 mg/dL   Calcium 8.4 (L) 8.9 - 10.3 mg/dL   Total Protein 8.0 6.5 - 8.1 g/dL   Albumin 3.5 3.5 - 5.0 g/dL   AST 75 (H) 15 - 41 U/L   ALT 157 (H) 0 - 44 U/L   Alkaline Phosphatase 91 38 - 126 U/L   Total Bilirubin 1.7 (H) 0.3 - 1.2 mg/dL   GFR calc non Af Amer >60 >60 mL/min   GFR calc Af Amer >60 >60 mL/min   Anion gap 12 5 - 15    Comment: Performed at [facility], [address] [place], [place] [address]  Troponin I - Once     Status: Abnormal   Collection Time: 12/07/18 10:10 PM  Result Value Ref Range   Troponin I 0.03 (HH) <0.03 ng/mL    Comment: CRITICAL RESULT CALLED TO, READ BACK BY AND VERIFIED WITH: M DOSS, RN  12/07/18 MKELLY Performed at [facility], [address], [place], [place] [address]   CBC with Differential     Status: None   Collection Time: 12/07/18 10:10 PM  Result Value Ref Range   WBC 9.8 4.0 - 10.5 K/uL   RBC 4.78 4.22 - 5.81 MIL/uL   Hemoglobin 13.6 13.0 - 17.0 g/dL   HCT 78.4 69.6 - 29.5 %   MCV 89.5 80.0 - 100.0 fL   MCH 28.5 26.0 - 34.0 pg   MCHC 31.8 30.0 - 36.0 g/dL   RDW 28.4 13.2 - 44.0 %   Platelets 358 150 - 400 K/uL   nRBC 0.0 0.0 -  0.2 %   Neutrophils Relative % 59 %   Neutro Abs 5.8 1.7 - 7.7 K/uL   Lymphocytes Relative 30 %   Lymphs Abs 3.0 0.7 - 4.0 K/uL   Monocytes Relative 10 %   Monocytes  Absolute 0.9 0.1 - 1.0 K/uL   Eosinophils Relative 0 %   Eosinophils Absolute 0.0 0.0 - 0.5 K/uL   Basophils Relative 1 %   Basophils Absolute 0.1 0.0 - 0.1 K/uL   Immature Granulocytes 0 %   Abs Immature Granulocytes 0.02 0.00 - 0.07 K/uL    Comment: Performed at [facility], [address] [place], [place] [address]  Lipase, blood     Status: None   Collection Time: 12/07/18 10:10 PM  Result Value Ref Range   Lipase 28 11 - 51 U/L    Comment: Performed at [facility], [address] [place], [place] [address]  Brain natriuretic peptide     Status: Abnormal   Collection Time: 12/07/18 10:10 PM  Result Value Ref Range   B Natriuretic Peptide 2,422.0 (H) 0.0 - 100.0 pg/mL    Comment: Performed at [facility], [address] [place], [place] [address]  D-dimer, quantitative     Status: Abnormal   Collection Time: 12/07/18 10:48 PM  Result Value Ref Range   D-Dimer, Quant 3.07 (H) 0.00 - 0.50 ug/mL-FEU    Comment: (NOTE) At the manufacturer cut-off of 0.50 ug/mL FEU, this assay has been documented to exclude PE with a sensitivity and negative predictive value of 97 to 99%.  At this time, this assay has not been approved by the FDA to exclude DVT/VTE. Results should be correlated with clinical presentation. Performed at [facility], [address] [place], [place] [address]   SARS Coronavirus 2 (CEPHEID - Performed in [facility] hospital lab), Hosp Order     Status: None   Collection Time: 12/07/18 11:02 PM  Result Value Ref Range   SARS Coronavirus 2 NEGATIVE NEGATIVE    Comment: (NOTE) If result is NEGATIVE SARS-CoV-2 target nucleic acids are NOT DETECTED. The SARS-CoV-2 RNA is generally detectable in upper and lower  respiratory specimens during the acute phase of infection. The lowest  concentration of SARS-CoV-2 viral copies this assay can detect is 250  copies / mL. A negative result does not preclude SARS-CoV-2 infection  and should not be used as the sole basis for  treatment or other  patient management decisions.  A negative result may occur with  improper specimen collection / handling, submission of specimen other  than nasopharyngeal swab, presence of viral mutation(s) within the  areas targeted by this assay, and inadequate number of viral copies  (<250 copies / mL). A negative result must be combined with clinical  observations, patient history, and epidemiological information. If result is POSITIVE SARS-CoV-2 target nucleic acids are DETECTED. The SARS-CoV-2 RNA is generally detectable in upper and lower  respiratory specimens dur ing the acute phase of infection.  Positive  results are indicative of active infection with SARS-CoV-2.  Clinical  correlation with patient history and other diagnostic information is  necessary to determine patient infection status.  Positive results do  not rule out bacterial infection or co-infection with other viruses. If result is PRESUMPTIVE POSTIVE SARS-CoV-2 nucleic acids MAY BE PRESENT.   A presumptive positive result was obtained on the submitted specimen  and confirmed on repeat testing.  While 2019 novel coronavirus  (SARS-CoV-2) nucleic acids may be present in the submitted sample  additional  confirmatory testing may be necessary for epidemiological  and / or clinical management purposes  to differentiate between  SARS-CoV-2 and other Sarbecovirus currently known to infect humans.  If clinically indicated additional testing with an alternate test  methodology [phone]) is advised. The SARS-CoV-2 RNA is generally  detectable in upper and lower respiratory sp ecimens during the acute  phase of infection. The expected result is Negative. Fact Sheet for Patients:  BoilerBrush.com.cy Fact Sheet for Healthcare Providers: https://pope.com/ This test is not yet approved or cleared by the [place] FDA and has been authorized for detection and/or  diagnosis of SARS-CoV-2 by FDA under an Emergency Use Authorization (EUA).  This EUA will remain in effect (meaning this test can be used) for the duration of the COVID-19 declaration under Section 564(b)(1) of the Act, 21 U.S.C. section 360bbb-3(b)(1), unless the authorization is terminated or revoked sooner. Performed at [facility], [address] [place], [place] [address]   Urinalysis, Routine w reflex microscopic     Status: Abnormal   Collection Time: 12/08/18 12:06 AM  Result Value Ref Range   Color, Urine YELLOW YELLOW   APPearance CLEAR CLEAR   Specific Gravity, Urine 1.015 1.005 - 1.030   pH 5.0 5.0 - 8.0   Glucose, UA NEGATIVE NEGATIVE mg/dL   Hgb urine dipstick NEGATIVE NEGATIVE   Bilirubin Urine NEGATIVE NEGATIVE   Ketones, ur NEGATIVE NEGATIVE mg/dL   Protein, ur 30 (A) NEGATIVE mg/dL   Nitrite NEGATIVE NEGATIVE   Leukocytes,Ua NEGATIVE NEGATIVE   WBC, UA 0-5 0 - 5 WBC/hpf   Bacteria, UA NONE SEEN NONE SEEN   Mucus PRESENT    Hyaline Casts, UA PRESENT     Comment: Performed at [facility], [address], [place], [place] [address]    Chemistries  Recent Labs  Lab 12/07/18 2210  NA 136  K 4.5  CL 101  CO2 23  GLUCOSE 124*  BUN 23*  CREATININE 0.95  CALCIUM 8.4*  AST 75*  ALT 157*  ALKPHOS 91  BILITOT 1.7*   ------------------------------------------------------------------------------------------------------------------  ------------------------------------------------------------------------------------------------------------------ GFR: Estimated Creatinine Clearance: 86.7 mL/min (by C-G formula based on SCr of 0.95 mg/dL). Liver Function Tests: Recent Labs  Lab 12/07/18 2210  AST 75*  ALT 157*  ALKPHOS 91  BILITOT 1.7*  PROT 8.0  ALBUMIN 3.5   Recent Labs  Lab 12/07/18 2210  LIPASE 28   No results for input(s): AMMONIA in the last 168 hours. Coagulation Profile: No results for input(s): INR, PROTIME in the last 168  hours. Cardiac Enzymes: Recent Labs  Lab 12/07/18 2210  TROPONINI 0.03*    --------------------------------------------------------------------------------------------------------------- Urine analysis:    Component Value Date/Time   COLORURINE YELLOW 12/08/2018 0006   APPEARANCEUR CLEAR 12/08/2018 0006   LABSPEC 1.015 12/08/2018 0006   PHURINE 5.0 12/08/2018 0006   GLUCOSEU NEGATIVE 12/08/2018 0006   HGBUR NEGATIVE 12/08/2018 0006   BILIRUBINUR NEGATIVE 12/08/2018 0006   KETONESUR NEGATIVE 12/08/2018 0006   PROTEINUR 30 (A) 12/08/2018 0006   NITRITE NEGATIVE 12/08/2018 0006   LEUKOCYTESUR NEGATIVE 12/08/2018 0006      Imaging Results:    Ct Angio Chest Pe W And/or Wo Contrast  Result Date: 12/08/2018 CLINICAL DATA:  Bilateral leg swelling and shortness of breath EXAM: CT ANGIOGRAPHY CHEST WITH CONTRAST TECHNIQUE: Multidetector CT imaging of the chest was performed using the standard protocol during bolus administration of intravenous contrast. Multiplanar CT image reconstructions and MIPs were obtained to evaluate the vascular anatomy. CONTRAST:  OMNIPAQUE IOHEXOL 350  MG/ML SOLN COMPARISON:  Chest x-ray 12/07/2018 FINDINGS: Cardiovascular: Satisfactory opacification of the pulmonary arteries to the segmental level. No evidence of pulmonary embolism. Nonaneurysmal aorta. Mild cardiomegaly. Trace pericardial effusion. Mediastinum/Nodes: Midline trachea. No thyroid mass. Mild fluid distended esophagus. No 6 adenopathy. Lungs/Pleura: Large right pleural effusion with compressive atelectasis at the right middle and lower lobes. Patchy focus of atelectasis or inflammatory infiltrate at the lingula. Left lower lobe anterior ovoid nodule measuring 2 cm with density values is continue fat containing lesion Upper Abdomen: Small amount of perihepatic ascites. Gas bubbles within the right upper anterior abdomen. Musculoskeletal: No acute or suspicious osseous abnormality Review of the MIP  images confirms the above findings. IMPRESSION: 1. Negative for acute pulmonary embolus. 2. Large right-sided pleural effusion compressive atelectasis at the right middle lobe and right lower lobe. Cardiomegaly 3. Small amount of perihepatic ascites. Incompletely visualized gas bubbles within the right upper quadrant anterior to the liver, uncertain if this represents a small amount of pneumoperitoneum versus gas bubbles in the high-riding loop of colon or bowel. CT abdomen pelvis is suggested to distinguish between the 2 possibilities. 4. 2 cm ovoid fat density nodule in the anterior left lower lobe, possibly a hamartoma Electronically Signed   By: Jasmine Pang M.D.   On: 12/08/2018 00:19   Ct Abdomen Pelvis W Contrast  Result Date: 12/08/2018 CLINICAL DATA:  Increasing shortness of breath, right-sided abdominal pain and swelling EXAM: CT ABDOMEN AND PELVIS WITH CONTRAST TECHNIQUE: Multidetector CT imaging of the abdomen and pelvis was performed using the standard protocol following bolus administration of intravenous contrast. CONTRAST:  50mL OMNIPAQUE IOHEXOL 300 MG/ML  SOLN COMPARISON:  CT angio chest 12/08/2018 FINDINGS: Lower chest: Moderate right pleural effusion with right basilar atelectasis. Stable cardiomegaly. 2.2 cm fluid attenuating nodular area along the left major fissure likely reflecting loculated pleural fluid. 12 mm left lower lobe subpleural pulmonary nodule. Hepatobiliary: 2.2 cm fluid attenuating nodular area along the left major fissure likely reflecting loculated pleural fluid. 12 mm left lower lobe subpleural pulmonary nodule. Pancreas: Unremarkable. No pancreatic ductal dilatation or surrounding inflammatory changes. Spleen: Normal in size without focal abnormality. Adrenals/Urinary Tract: Adrenal glands are unremarkable. Kidneys are normal, without renal calculi, focal lesion, or hydronephrosis. Bladder is unremarkable. Stomach/Bowel: Stomach is within normal limits. Appendix appears  normal. No evidence of bowel wall thickening, distention, or inflammatory changes. Vascular/Lymphatic: No significant vascular findings are present. No enlarged abdominal or pelvic lymph nodes. Reproductive: Prostate is unremarkable. Other: No abdominal wall hernia or abnormality. Small amount of pelvic free fluid. Generalized anasarca. Musculoskeletal: No acute osseous abnormality. No aggressive osseous lesion. IMPRESSION: 1. Moderate right pleural effusion with compressive atelectasis. 2. Small amount of abdominal and pelvic free fluid of uncertain etiology. 3. Anasarca. 4. 10 mm subpleural left lower lobe pulmonary nodule. Recommend follow-up CT of the chest in 3 months. This recommendation follows the consensus statement: Guidelines for Management of Incidental Pulmonary Nodules Detected on CT Images: From the Fleischner Society 2017; Radiology 2017; 284:228-243. Electronically Signed   By: Elige Ko   On: 12/08/2018 00:57   Dg Chest Portable 1 View  Result Date: 12/07/2018 CLINICAL DATA:  Shortness of breath EXAM: PORTABLE CHEST 1 VIEW COMPARISON:  10/25/2018 FINDINGS: Cardiac shadow is enlarged but stable. Resolution of left-sided pleural effusion and atelectasis is noted. New right-sided pleural effusion and atelectasis is seen. No other focal abnormality is noted. IMPRESSION: Right basilar atelectasis and effusion. Electronically Signed   By: Alcide Clever M.D.   On:  12/07/2018 22:30    My personal review of EKG: Rhythm NSR, T wave inversions in lead V5 V6, lead II and lead III lead aVF   Assessment & Plan:    Active Problems:   Acute systolic CHF (congestive heart failure) (HCC)   1. Acute systolic CHF-patient's EF as per echo at [facility]  was 25 to 30%.  Patient received 40 mg IV Lasix in the ED.  We will continue with Lasix 40 mg IV every 12 hours.  Daily BMP, daily weights, intake and output.  We will also consult cardiology in a.m as patient might need further evaluation for CHF.   We will continue with metoprolol, lisinopril.  2. Abnormal EKG-patient's EKG shows T wave inversions in lead II, III, aVF, V5 V6.  Denies chest pain at this time.  No previous EKG to compare.  Troponin is 0.03.  BNP two 422.0.  Will obtain serial troponin every 6 hours x3.  Cardiology will be consulted as above.  3. Pulmonary nodule- 10 mm nodule seen on CT chest. Will need repeat CT chest in 3 months as outpatient.   DVT Prophylaxis-   Lovenox   AM Labs Ordered, also please review Full Orders  Family Communication: Admission, patients condition and plan of care including tests being ordered have been discussed with the patient  who indicate understanding and agree with the plan and Code Status.  Code Status: Full code  Admission status: Inpatient: Based on patients clinical presentation and evaluation of above clinical data, I have made determination that patient meets Inpatient criteria at this time.  Time spent in minutes : 60 minutes   Meredeth Ide M.D on 12/08/2018 at 2:51 AM

## 2018-12-08 NOTE — Progress Notes (Signed)
Since being transferred from CCU earlier today and been ambulating in room, alert and oriented.  Stated feels much better and that his swelling in legs has improved.

## 2018-12-08 NOTE — Progress Notes (Signed)
ASSUMPTION OF CARE COVERAGE NOTE   12/08/2018 9:29 AM  Dennis Andrews was seen and examined.  The H&P by the admitting provider, orders, imaging was reviewed.  Please see new orders.  Will continue to follow.  The patient is diuresing and feeling better.  His main problem is that he ran out of his cardiac meds.  I will ask care management to work with him for assistance with medications.   He remains volume overloaded but overall improving.   Vitals:   12/08/18 0740 12/08/18 0921  BP:    Pulse:  (!) 117  Resp:    Temp: 98 F (36.7 C)   SpO2:      Results for orders placed or performed during the hospital encounter of 12/07/18  SARS Coronavirus 2 (CEPHEID - Performed in [facility] hospital lab), [facility] Order  Result Value Ref Range   SARS Coronavirus 2 NEGATIVE NEGATIVE  MRSA PCR Screening  Result Value Ref Range   MRSA by PCR NEGATIVE NEGATIVE  Comprehensive metabolic panel  Result Value Ref Range   Sodium 136 135 - 145 mmol/L   Potassium 4.5 3.5 - 5.1 mmol/L   Chloride 101 98 - 111 mmol/L   CO2 23 22 - 32 mmol/L   Glucose, Bld 124 (H) 70 - 99 mg/dL   BUN 23 (H) 6 - 20 mg/dL   Creatinine, Ser 4.090.95 0.61 - 1.24 mg/dL   Calcium 8.4 (L) 8.9 - 10.3 mg/dL   Total Protein 8.0 6.5 - 8.1 g/dL   Albumin 3.5 3.5 - 5.0 g/dL   AST 75 (H) 15 - 41 U/L   ALT 157 (H) 0 - 44 U/L   Alkaline Phosphatase 91 38 - 126 U/L   Total Bilirubin 1.7 (H) 0.3 - 1.2 mg/dL   GFR calc non Af Amer >60 >60 mL/min   GFR calc Af Amer >60 >60 mL/min   Anion gap 12 5 - 15  Troponin I - Once  Result Value Ref Range   Troponin I 0.03 (HH) <0.03 ng/mL  CBC with Differential  Result Value Ref Range   WBC 9.8 4.0 - 10.5 K/uL   RBC 4.78 4.22 - 5.81 MIL/uL   Hemoglobin 13.6 13.0 - 17.0 g/dL   HCT 81.142.8 91.439.0 - 78.252.0 %   MCV 89.5 80.0 - 100.0 fL   MCH 28.5 26.0 - 34.0 pg   MCHC 31.8 30.0 - 36.0 g/dL   RDW 95.615.5 21.311.5 - 08.615.5 %   Platelets 358 150 - 400 K/uL   nRBC 0.0 0.0 - 0.2 %   Neutrophils Relative % 59 %   Neutro Abs 5.8 1.7 - 7.7 K/uL   Lymphocytes Relative 30 %   Lymphs Abs 3.0 0.7 - 4.0 K/uL   Monocytes Relative 10 %   Monocytes Absolute 0.9 0.1 - 1.0 K/uL   Eosinophils Relative 0 %   Eosinophils Absolute 0.0 0.0 - 0.5 K/uL   Basophils Relative 1 %   Basophils Absolute 0.1 0.0 - 0.1 K/uL   Immature Granulocytes 0 %   Abs Immature Granulocytes 0.02 0.00 - 0.07 K/uL  Lipase, blood  Result Value Ref Range   Lipase 28 11 - 51 U/L  Brain natriuretic peptide  Result Value Ref Range   B Natriuretic Peptide 2,422.0 (H) 0.0 - 100.0 pg/mL  Urinalysis, Routine w reflex microscopic  Result Value Ref Range   Color, Urine YELLOW YELLOW   APPearance CLEAR CLEAR   Specific Gravity, Urine 1.015 1.005 - 1.030  pH 5.0 5.0 - 8.0   Glucose, UA NEGATIVE NEGATIVE mg/dL   Hgb urine dipstick NEGATIVE NEGATIVE   Bilirubin Urine NEGATIVE NEGATIVE   Ketones, ur NEGATIVE NEGATIVE mg/dL   Protein, ur 30 (A) NEGATIVE mg/dL   Nitrite NEGATIVE NEGATIVE   Leukocytes,Ua NEGATIVE NEGATIVE   WBC, UA 0-5 0 - 5 WBC/hpf   Bacteria, UA NONE SEEN NONE SEEN   Mucus PRESENT    Hyaline Casts, UA PRESENT   D-dimer, quantitative  Result Value Ref Range   D-Dimer, Quant 3.07 (H) 0.00 - 0.50 ug/mL-FEU  Troponin I - Now Then Q6H  Result Value Ref Range   Troponin I 0.03 (HH) <0.03 ng/mL  CBC  Result Value Ref Range   WBC 9.5 4.0 - 10.5 K/uL   RBC 4.77 4.22 - 5.81 MIL/uL   Hemoglobin 13.6 13.0 - 17.0 g/dL   HCT 19.7 58.8 - 32.5 %   MCV 90.6 80.0 - 100.0 fL   MCH 28.5 26.0 - 34.0 pg   MCHC 31.5 30.0 - 36.0 g/dL   RDW 49.8 (H) 26.4 - 15.8 %   Platelets 367 150 - 400 K/uL   nRBC 0.0 0.0 - 0.2 %  Basic metabolic panel  Result Value Ref Range   Sodium 137 135 - 145 mmol/L   Potassium 4.4 3.5 - 5.1 mmol/L   Chloride 99 98 - 111 mmol/L   CO2 25 22 - 32 mmol/L   Glucose, Bld 109 (H) 70 - 99 mg/dL   BUN 25 (H) 6 - 20 mg/dL   Creatinine, Ser 3.09 (H) 0.61 - 1.24 mg/dL   Calcium 8.5 (L) 8.9 - 10.3 mg/dL   GFR calc  non Af Amer >60 >60 mL/min   GFR calc Af Amer >60 >60 mL/min   Anion gap 13 5 - 15     C. Laural Benes, MD [facility]   12/07/2018  9:24 PM How to contact the [facility] Attending or Consulting provider 7A - 7P or covering provider during after hours 7P -7A, for this patient?  1. Check the care team in [facility] and look for a) attending/consulting TR[facility] provider listed and b) the [facility] team listed 2. Log into [email or web address] and use [place]'s universal password to access. If you do not have the password, please contact the hospital operator. 3. Locate the [facility] provider you are looking for under [facility] and page to a number that you can be directly reached. 4. If you still have difficulty reaching the provider, please page the [facility] (Director on Call) for the Hospitalists listed on amion for assistance.

## 2018-12-08 NOTE — TOC Initial Note (Signed)
Transition of Care [facility]) - Initial/Assessment Note    Patient Details  Name: Dennis Andrews MRN: [id number] Date of Birth: 28-Jan-1971  [facility]) CM/SW Contact:    Elliot Gault, LCSW Phone Number: 12/08/2018, 11:30 AM  Clinical Narrative:                  Received consult to see pt for medication assistance and follow up medical appointments. Spoke with pt today to assess. Per pt, he has been homeless for the last 3-4 years. He has lived in a shelter or with other people off an on during this time. Pt is not currently employed and he has no income. Pt does not have insurance. He does not have a phone and he does not have his own vehicle. Pt states that he does receive food stamp assistance. Pt was hospitalized at [facility] about a month ago and at the time of dc from there, he did obtain a one month supply of his medication with the help of a friend who paid for them. Pt states that he wasn't able to make arrangements to refill the prescriptions and he ran out earlier this week which led to this admission.   Pt is currently staying with a friend in [place]. He states that he does have someone who is going to come and get him at dc. Per pt, he is going to ask this friend if he can help him with the co-pays for MATCH RX's at dc. Pt aware that there is a $3 co-pay per prescription. Pt agreeable to appointment at [facility] for hospital follow up and to establish primary care. Per pt, he feels confident that his friend, Doreatha Martin, will be able to take him to the appointment. Pt aware that he needs to call and reschedule if his friend is unable to take him at the scheduled appointment time. New patient paperwork will be given to pt prior to dc and pt instructed he will need to fill out and bring to the appointment.   Discussed pt's history of ETOH dependence with pt who states that he has not had any ETOH since right before he went in to [facility] a month ago. Encouraged pt to  continue to abstain. Pt states that the hardest part for him is that he has trouble falling asleep/staying asleep and that is mostly why he used to drink. Per pt, since he stopped drinking, he is having these troubles with his sleep again. Updated MD and relayed pt's request to discuss this with him.   Will follow up tomorrow to further assist with pt's TOC needs.  Expected Discharge Plan: Home/Self Care Barriers to Discharge: Continued Medical Work up   Patient Goals and CMS Choice        Expected Discharge Plan and Services Expected Discharge Plan: Home/Self Care In-house Referral: Clinical Social Work Discharge Planning Services: Medication Assistance, Follow-up appt scheduled, Indigent Health Clinic, [facility]   Living arrangements for the past 2 months: Single Family Home                                      Prior Living Arrangements/Services Living arrangements for the past 2 months: Single Family Home Lives with:: Friends Patient language and need for interpreter reviewed:: Yes Do you feel safe going back to the place where you live?: Yes      Need for [facility] Participation  in Patient Care: No (Comment) Care giver support system in place?: Yes (comment)   Criminal Activity/Legal Involvement Pertinent to Current Situation/Hospitalization: No - Comment as needed  Activities of Daily Living Home Assistive Devices/Equipment: None ADL Screening (condition at time of admission) Patient's cognitive ability adequate to safely complete daily activities?: Yes Is the patient deaf or have difficulty hearing?: No Does the patient have difficulty seeing, even when wearing glasses/contacts?: No Does the patient have difficulty concentrating, remembering, or making decisions?: No Patient able to express need for assistance with ADLs?: Yes Does the patient have difficulty dressing or bathing?: No Independently performs ADLs?: Yes (appropriate for developmental age) Does  the patient have difficulty walking or climbing stairs?: Yes Weakness of Legs: Both Weakness of Arms/Hands: Both  Permission Sought/Granted                  Emotional Assessment Appearance:: Appears stated age Attitude/Demeanor/Rapport: Engaged Affect (typically observed): Calm, Pleasant Orientation: : Oriented to Self, Oriented to Place, Oriented to  Time, Oriented to Situation Alcohol / Substance Use: Alcohol Use Psych Involvement: No (comment)  Admission diagnosis:  Anasarca [R60.1] Patient Active Problem List   Diagnosis Date Noted  . Acute systolic CHF (congestive heart failure) (HCC) 12/08/2018  . Anasarca   . EtOH dependence (HCC) 12/04/2013   PCP:  Patient, No Pcp Per Pharmacy:   [facility] [address] [facility] [address] [facility] [place] - [address] [place] [id number] Phone: [phone] Fax: [phone]     Social Determinants of Health (SDOH) Interventions    Readmission Risk Interventions No flowsheet data found.

## 2018-12-09 LAB — BASIC METABOLIC PANEL
Anion gap: 10 (ref 5–15)
BUN: 33 mg/dL — ABNORMAL HIGH (ref 6–20)
CO2: 26 mmol/L (ref 22–32)
Calcium: 8.3 mg/dL — ABNORMAL LOW (ref 8.9–10.3)
Chloride: 98 mmol/L (ref 98–111)
Creatinine, Ser: 1.08 mg/dL (ref 0.61–1.24)
GFR calc Af Amer: >60 mL/min (ref >60)
GFR calc non Af Amer: >60 mL/min (ref >60)
Glucose, Bld: 116 mg/dL — ABNORMAL HIGH (ref 70–99)
Potassium: 5.1 mmol/L (ref 3.5–5.1)
Sodium: 134 mmol/L — ABNORMAL LOW (ref 135–145)

## 2018-12-09 LAB — HIV ANTIBODY (ROUTINE TESTING W REFLEX): HIV Screen 4th Generation wRfx: NONREACTIVE

## 2018-12-09 MED ORDER — NICOTINE 21 MG/24HR TD PT24: 21.0000 mg | MEDICATED_PATCH | TRANSDERMAL | 0 refills | Status: DC

## 2018-12-09 MED ORDER — FOLIC ACID 1 MG PO TABS: 1.0000 mg | ORAL_TABLET | Freq: Every day | ORAL | 0 refills | Status: DC

## 2018-12-09 MED ORDER — TRAZODONE HCL 50 MG PO TABS: 50.0000 mg | ORAL_TABLET | Freq: Every evening | ORAL | 0 refills | Status: DC | PRN

## 2018-12-09 MED ORDER — CARVEDILOL 6.25 MG PO TABS: 6.2500 mg | ORAL_TABLET | Freq: Two times a day (BID) | ORAL | 0 refills | Status: DC

## 2018-12-09 MED ORDER — FOLIC ACID 1 MG PO TABS: 1.0000 mg | ORAL_TABLET | Freq: Every day | ORAL | 0 refills | Status: AC

## 2018-12-09 MED ORDER — POTASSIUM CHLORIDE ER 10 MEQ PO TBCR: 10.0000 meq | EXTENDED_RELEASE_TABLET | Freq: Every day | ORAL | 0 refills | Status: AC

## 2018-12-09 MED ORDER — CARVEDILOL 3.125 MG PO TABS
6.2500 mg | ORAL_TABLET | Freq: Two times a day (BID) | ORAL | Status: DC
  Administered 2018-12-09: 6.25 mg via ORAL
  Filled 2018-12-09: qty 2

## 2018-12-09 MED ORDER — CARVEDILOL 6.25 MG PO TABS: 6.2500 mg | ORAL_TABLET | Freq: Two times a day (BID) | ORAL | 0 refills | Status: AC

## 2018-12-09 MED ORDER — NICOTINE 21 MG/24HR TD PT24: 21.0000 mg | MEDICATED_PATCH | TRANSDERMAL | 0 refills | Status: AC

## 2018-12-09 MED ORDER — FUROSEMIDE 40 MG PO TABS: 40.0000 mg | ORAL_TABLET | Freq: Every day | ORAL | 0 refills | Status: AC

## 2018-12-09 MED ORDER — LISINOPRIL 2.5 MG PO TABS: 2.5000 mg | ORAL_TABLET | Freq: Every day | ORAL | 0 refills | Status: AC

## 2018-12-09 MED ORDER — VITAMIN B-1 100 MG PO TABS: 100.0000 mg | ORAL_TABLET | Freq: Every day | ORAL | 0 refills | Status: DC

## 2018-12-09 MED ORDER — LISINOPRIL 2.5 MG PO TABS: 2.5000 mg | ORAL_TABLET | Freq: Every day | ORAL | 0 refills | Status: DC

## 2018-12-09 MED ORDER — POTASSIUM CHLORIDE ER 10 MEQ PO TBCR: 10.0000 meq | EXTENDED_RELEASE_TABLET | Freq: Every day | ORAL | 0 refills | Status: DC

## 2018-12-09 MED ORDER — TRAZODONE HCL 50 MG PO TABS: 50.0000 mg | ORAL_TABLET | Freq: Every evening | ORAL | 0 refills | Status: AC | PRN

## 2018-12-09 MED ORDER — VITAMIN B-1 100 MG PO TABS: 100.0000 mg | ORAL_TABLET | Freq: Every day | ORAL | 0 refills | Status: AC

## 2018-12-09 MED ORDER — FUROSEMIDE 40 MG PO TABS
40.0000 mg | ORAL_TABLET | Freq: Every day | ORAL | Status: DC
  Administered 2018-12-09: 10:00:00 40 mg via ORAL
  Filled 2018-12-09: qty 1

## 2018-12-09 MED ORDER — FUROSEMIDE 40 MG PO TABS: 40.0000 mg | ORAL_TABLET | Freq: Every day | ORAL | 0 refills | Status: DC

## 2018-12-09 NOTE — Plan of Care (Signed)

## 2018-12-09 NOTE — TOC Transition Note (Signed)
Transition of Care [facility]) - CM/SW Discharge Note   Patient Details  Name: Dennis Andrews MRN: [id number] Date of Birth: 1970/08/26  [facility]) CM/SW Contact:  Elliot Gault, LCSW Phone Number: 12/09/2018, 10:14 AM   Clinical Narrative:     Pt stable for dc today per MD. Follow up appointments are arranged. Providing MATCH voucher to pt for prescriptions. SCAT pass provided in case pt cannot get a ride from his friend. Pt expresses understanding and  appreciation and states that he will follow up as instructed. There are no other TOC needs for dc.   Final next level of care: Home/Self Care Barriers to Discharge: Barriers Resolved   Patient Goals and CMS Choice        Discharge Placement                       Discharge Plan and Services In-house Referral: Clinical Social Work Discharge Planning Services: Medication Assistance, Follow-up appt scheduled, Indigent Health Clinic, [facility]                                 Social Determinants of Health (SDOH) Interventions     Readmission Risk Interventions Readmission Risk Prevention Plan 12/09/2018 12/08/2018  Post Dischage Appt - Complete  Medication Screening Complete -  Transportation Screening - Complete  Some recent data might be hidden

## 2018-12-09 NOTE — Discharge Summary (Signed)
Physician Discharge Summary  [name] W Boehler WUJ:[id number] DOB: 02-29-1971 DOA: 12/07/2018  PCP[facility] Ref[facility] to [facility] with [facility] and CHF clinic  Admit date: 12/07/2018 Discharge date: 12/09/2018  Admitted From: Home  Disposition: Home   Recommendations for Outpatient Follow-up:  1. Follow up with PCP next week as scheduled 2. Establish care with Cardiology clinic (Referral made)  3. Please obtain BMP/CBC in 1-2 weeks  Discharge Condition: STABLE   CODE STATUS: FULL    Brief Hospitalization Summary: Please see all hospital notes, images, labs for full details of the hospitalization.  Dr. Daune PerchLama's HPI:  Dennis Andrews  is a 48 y.o. male, with history of hypertension, alcohol dependence who came to the ED with complaints of bilateral lower extremities swelling.  [facility] for pneumonia and right pleural effusion.  At that time echocardiogram showed EF 25 to 30%, patient was discharged on metoprolol, Lasix, ciprofloxacin, lisinopril.  Patient ran out of these medications 4 days ago and presented to the ED with bilateral lower extremity swelling.  He also complained of some shortness of breath.  He is not requiring oxygen in the ED.  O2 sats 94 to 98% on room air. Patient was given Lasix 40 mg IV in the ED. Lab work showed elevated d-dimer, 3.07, BNP  2422.0 CTA chest was done which was negative for PE.  There was a concern for possible pneumoperitoneum versus gas bubbles in the colon.  CT abdomen pelvis was done which did not show any significant abnormality except for moderate right pleural effusion. SARS 2covid was negative in the ED. He denies chest pain Denies nausea vomiting or diarrhea. Denies fever chills. Patient says that he has not had alcohol since he was [facility].  The patient was admitted with acute systolic CHF exacerbation precipitated  by not having his medications.  He was started on IV Lasix for diuresis which improved his symptoms considerably.  He has diuresed well.  He has been restarted on his cardiac medications and feeling much better.  He has been given a Buyer, retailmatch voucher to assist him with his medications.  [facility].  Referral was made for him to the heart care clinic and advanced heart failure clinic to establish care.  I gave him the information and numbers to call and make an appointment.  The patient agreed to do so.  The patient has a appointment on June 6 to establish primary care with Dr. Dahlia ClientBrowning.  Patient was strongly advised to stop smoking and drinking alcohol.  He will be given nicotine patches at discharge.  He has not had a drink since he was hospitalized several [facility].  He was encouraged to avoid alcohol consumption.  His renal function has improved with diuresis.  I recommend he get a BMP checked when he establishes care with his primary care provider next week.  Discharge Diagnoses:  Active Problems:   Acute systolic CHF (congestive heart failure) (HCC)   Anasarca   Discharge Instructions: Discharge Instructions    (HEART FAILURE PATIENTS) Call MD:  Anytime you have any of the following symptoms: 1) 3 pound weight gain in 24 hours or 5 pounds in 1 week 2) shortness of breath, with or without a dry hacking cough 3) swelling in the hands, feet or stomach 4) if you have to sleep  on extra pillows at night in order to breathe.   Complete by:  As directed    AMB referral to CHF clinic   Complete by:  As directed    Call MD for:  difficulty breathing, headache or visual disturbances   Complete by:  As directed    Call MD for:  persistant dizziness or light-headedness   Complete by:  As directed    Diet - low sodium heart healthy   Complete by:  As directed    Increase activity slowly   Complete by:  As directed       Allergies as of 12/09/2018      Reactions   Penicillins Hives, Itching      Medication List    STOP taking these medications   citalopram 20 MG tablet Commonly known as:  CELEXA   naproxen 500 MG tablet Commonly known as:  NAPROSYN   traMADol 50 MG tablet Commonly known as:  ULTRAM     TAKE these medications   carvedilol 6.25 MG tablet Commonly known as:  COREG Take 1 tablet (6.25 mg total) by mouth 2 (two) times daily with a meal for 30 days.   folic acid 1 MG tablet Commonly known as:  FOLVITE Take 1 tablet (1 mg total) by mouth daily for 30 days.   furosemide 40 MG tablet Commonly known as:  LASIX Take 1 tablet (40 mg total) by mouth daily for 30 days.   lisinopril 2.5 MG tablet Commonly known as:  ZESTRIL Take 1 tablet (2.5 mg total) by mouth daily for 30 days.   nicotine 21 mg/24hr patch Commonly known as:  NICODERM CQ - dosed in mg/24 hours Place 1 patch (21 mg total) onto the skin daily for 28 days.   potassium chloride 10 MEQ tablet Commonly known as:  K-DUR Take 1 tablet (10 mEq total) by mouth daily for 30 days.   thiamine 100 MG tablet Commonly known as:  VITAMIN B-1 Take 1 tablet (100 mg total) by mouth daily for 30 days.   traZODone 50 MG tablet Commonly known as:  DESYREL Take 1 tablet (50 mg total) by mouth at bedtime as needed for sleep. What changed:  how much to take      Follow-up Information    [facility] Dr. [name]. Go on 12/20/2018.   Why:  Please arrive by 5:30pm for a hospital follow up appointment with Dr. [name]. Bring your new patient paperwork with you. If you are unable to make this appointment, please call the clinic to reschedule within 24 hours of the appointment time. Contact information: [address] [place], [place] [address]  [phone]       [facility]. Schedule an appointment as soon as possible for a visit in 2 week(s).   Specialty:  Cardiology Why:  Establish Care for congestive heart  failure Contact information: [address] [place] [address] [phone]         Allergies  Allergen Reactions  . Penicillins Hives and Itching   Allergies as of 12/09/2018      Reactions   Penicillins Hives, Itching      Medication List    STOP taking these medications   citalopram 20 MG tablet Commonly known as:  CELEXA   naproxen 500 MG tablet Commonly known as:  NAPROSYN   traMADol 50 MG tablet Commonly known as:  ULTRAM     TAKE these medications   carvedilol 6.25 MG tablet Commonly  known as:  COREG Take 1 tablet (6.25 mg total) by mouth 2 (two) times daily with a meal for 30 days.   folic acid 1 MG tablet Commonly known as:  FOLVITE Take 1 tablet (1 mg total) by mouth daily for 30 days.   furosemide 40 MG tablet Commonly known as:  LASIX Take 1 tablet (40 mg total) by mouth daily for 30 days.   lisinopril 2.5 MG tablet Commonly known as:  ZESTRIL Take 1 tablet (2.5 mg total) by mouth daily for 30 days.   nicotine 21 mg/24hr patch Commonly known as:  NICODERM CQ - dosed in mg/24 hours Place 1 patch (21 mg total) onto the skin daily for 28 days.   potassium chloride 10 MEQ tablet Commonly known as:  K-DUR Take 1 tablet (10 mEq total) by mouth daily for 30 days.   thiamine 100 MG tablet Commonly known as:  VITAMIN B-1 Take 1 tablet (100 mg total) by mouth daily for 30 days.   traZODone 50 MG tablet Commonly known as:  DESYREL Take 1 tablet (50 mg total) by mouth at bedtime as needed for sleep. What changed:  how much to take       Procedures/Studies: Ct Angio Chest Pe W And/or Wo Contrast  Result Date: 12/08/2018 CLINICAL DATA:  Bilateral leg swelling and shortness of breath EXAM: CT ANGIOGRAPHY CHEST WITH CONTRAST TECHNIQUE: Multidetector CT imaging of the chest was performed using the standard protocol during bolus administration of intravenous contrast. Multiplanar CT image reconstructions and MIPs were obtained to evaluate  the vascular anatomy. CONTRAST:  OMNIPAQUE IOHEXOL 350 MG/ML SOLN COMPARISON:  Chest x-ray 12/07/2018 FINDINGS: Cardiovascular: Satisfactory opacification of the pulmonary arteries to the segmental level. No evidence of pulmonary embolism. Nonaneurysmal aorta. Mild cardiomegaly. Trace pericardial effusion. Mediastinum/Nodes: Midline trachea. No thyroid mass. Mild fluid distended esophagus. No 6 adenopathy. Lungs/Pleura: Large right pleural effusion with compressive atelectasis at the right middle and lower lobes. Patchy focus of atelectasis or inflammatory infiltrate at the lingula. Left lower lobe anterior ovoid nodule measuring 2 cm with density values is continue fat containing lesion Upper Abdomen: Small amount of perihepatic ascites. Gas bubbles within the right upper anterior abdomen. Musculoskeletal: No acute or suspicious osseous abnormality Review of the MIP images confirms the above findings. IMPRESSION: 1. Negative for acute pulmonary embolus. 2. Large right-sided pleural effusion compressive atelectasis at the right middle lobe and right lower lobe. Cardiomegaly 3. Small amount of perihepatic ascites. Incompletely visualized gas bubbles within the right upper quadrant anterior to the liver, uncertain if this represents a small amount of pneumoperitoneum versus gas bubbles in the high-riding loop of colon or bowel. CT abdomen pelvis is suggested to distinguish between the 2 possibilities. 4. 2 cm ovoid fat density nodule in the anterior left lower lobe, possibly a hamartoma Electronically Signed   By: Jasmine Pang M.D.   On: 12/08/2018 00:19   Ct Abdomen Pelvis W Contrast  Result Date: 12/08/2018 CLINICAL DATA:  Increasing shortness of breath, right-sided abdominal pain and swelling EXAM: CT ABDOMEN AND PELVIS WITH CONTRAST TECHNIQUE: Multidetector CT imaging of the abdomen and pelvis was performed using the standard protocol following bolus administration of intravenous contrast. CONTRAST:   84mL OMNIPAQUE IOHEXOL 300 MG/ML  SOLN COMPARISON:  CT angio chest 12/08/2018 FINDINGS: Lower chest: Moderate right pleural effusion with right basilar atelectasis. Stable cardiomegaly. 2.2 cm fluid attenuating nodular area along the left major fissure likely reflecting loculated pleural fluid. 12 mm left lower lobe subpleural pulmonary  nodule. Hepatobiliary: 2.2 cm fluid attenuating nodular area along the left major fissure likely reflecting loculated pleural fluid. 12 mm left lower lobe subpleural pulmonary nodule. Pancreas: Unremarkable. No pancreatic ductal dilatation or surrounding inflammatory changes. Spleen: Normal in size without focal abnormality. Adrenals/Urinary Tract: Adrenal glands are unremarkable. Kidneys are normal, without renal calculi, focal lesion, or hydronephrosis. Bladder is unremarkable. Stomach/Bowel: Stomach is within normal limits. Appendix appears normal. No evidence of bowel wall thickening, distention, or inflammatory changes. Vascular/Lymphatic: No significant vascular findings are present. No enlarged abdominal or pelvic lymph nodes. Reproductive: Prostate is unremarkable. Other: No abdominal wall hernia or abnormality. Small amount of pelvic free fluid. Generalized anasarca. Musculoskeletal: No acute osseous abnormality. No aggressive osseous lesion. IMPRESSION: 1. Moderate right pleural effusion with compressive atelectasis. 2. Small amount of abdominal and pelvic free fluid of uncertain etiology. 3. Anasarca. 4. 10 mm subpleural left lower lobe pulmonary nodule. Recommend follow-up CT of the chest in 3 months. This recommendation follows the consensus statement: Guidelines for Management of Incidental Pulmonary Nodules Detected on CT Images: From the Fleischner Society 2017; Radiology 2017; 284:228-243. Electronically Signed   By: Elige Ko   On: 12/08/2018 00:57   Dg Chest Port 1 View  Result Date: 12/08/2018 CLINICAL DATA:  Bilateral leg swelling.  Shortness of breath.  EXAM: PORTABLE CHEST 1 VIEW COMPARISON:  CT 12/07/2018.  Chest x-ray 12/07/2018. FINDINGS: Mediastinum hilar structures normal. Cardiomegaly with normal pulmonary vascularity. Right lung base atelectasis/infiltrate and moderate right-sided pleural effusion. Similar findings noted on prior exams. No pneumothorax. No acute bony abnormality. IMPRESSION: 1.  Stable cardiomegaly.  Normal pulmonary vascularity. 2. Persistent right lung base atelectasis/infiltrate and moderate right-sided pleural effusion. Similar findings noted on prior exam. Electronically Signed   By: Maisie Fus  Register   On: 12/08/2018 07:34   Dg Chest Portable 1 View  Result Date: 12/07/2018 CLINICAL DATA:  Shortness of breath EXAM: PORTABLE CHEST 1 VIEW COMPARISON:  10/25/2018 FINDINGS: Cardiac shadow is enlarged but stable. Resolution of left-sided pleural effusion and atelectasis is noted. New right-sided pleural effusion and atelectasis is seen. No other focal abnormality is noted. IMPRESSION: Right basilar atelectasis and effusion. Electronically Signed   By: Alcide Clever M.D.   On: 12/07/2018 22:30      Subjective: Patient says he is feeling much better.  He would like to go home.  He is in no distress and cooperative.  He is very happy that the edema in his legs has improved considerably.  Discharge Exam: Vitals:   12/08/18 2103 12/09/18 0629  BP: (!) 114/95 109/80  Pulse: (!) 113 (!) 111  Resp: 20 (!) 24  Temp: (!) 97.5 F (36.4 C) 98.2 F (36.8 C)  SpO2: 98%    Vitals:   12/08/18 1427 12/08/18 1933 12/08/18 2103 12/09/18 0629  BP: 105/81  (!) 114/95 109/80  Pulse: (!) 101  (!) 113 (!) 111  Resp: 16  20 (!) 24  Temp: 98.3 F (36.8 C)  (!) 97.5 F (36.4 C) 98.2 F (36.8 C)  TempSrc: Oral  Oral Oral  SpO2: 95% 92% 98%   Weight:    75.7 kg  Height:        General: Pt is alert, awake, not in acute distress Cardiovascular: normal S1/S2 +, no rubs, no gallops Respiratory: CTA bilaterally, no wheezing, no  rhonchi Abdominal: Soft, NT, ND, bowel sounds + Extremities: Bilateral trace pretibial edema, no cyanosis   The results of significant diagnostics from this hospitalization (including imaging, microbiology, ancillary and laboratory)  are listed below for reference.     Microbiology: Recent Results (from the past 240 hour(s))  SARS Coronavirus 2 (CEPHEID - Performed in [facility] hospital lab), Hosp Order     Status: None   Collection Time: 12/07/18 11:02 PM  Result Value Ref Range Status   SARS Coronavirus 2 NEGATIVE NEGATIVE Final    Comment: (NOTE) If result is NEGATIVE SARS-CoV-2 target nucleic acids are NOT DETECTED. The SARS-CoV-2 RNA is generally detectable in upper and lower  respiratory specimens during the acute phase of infection. The lowest  concentration of SARS-CoV-2 viral copies this assay can detect is 250  copies / mL. A negative result does not preclude SARS-CoV-2 infection  and should not be used as the sole basis for treatment or other  patient management decisions.  A negative result may occur with  improper specimen collection / handling, submission of specimen other  than nasopharyngeal swab, presence of viral mutation(s) within the  areas targeted by this assay, and inadequate number of viral copies  (<250 copies / mL). A negative result must be combined with clinical  observations, patient history, and epidemiological information. If result is POSITIVE SARS-CoV-2 target nucleic acids are DETECTED. The SARS-CoV-2 RNA is generally detectable in upper and lower  respiratory specimens dur ing the acute phase of infection.  Positive  results are indicative of active infection with SARS-CoV-2.  Clinical  correlation with patient history and other diagnostic information is  necessary to determine patient infection status.  Positive results do  not rule out bacterial infection or co-infection with other viruses. If result is PRESUMPTIVE POSTIVE SARS-CoV-2 nucleic  acids MAY BE PRESENT.   A presumptive positive result was obtained on the submitted specimen  and confirmed on repeat testing.  While 2019 novel coronavirus  (SARS-CoV-2) nucleic acids may be present in the submitted sample  additional confirmatory testing may be necessary for epidemiological  and / or clinical management purposes  to differentiate between  SARS-CoV-2 and other Sarbecovirus currently known to infect humans.  If clinically indicated additional testing with an alternate test  methodology [id number]) is advised. The SARS-CoV-2 RNA is generally  detectable in upper and lower respiratory sp ecimens during the acute  phase of infection. The expected result is Negative. Fact Sheet for Patients:  BoilerBrush.com.cy Fact Sheet for Healthcare Providers: https://pope.com/ This test is not yet approved or cleared by the [place] FDA and has been authorized for detection and/or diagnosis of SARS-CoV-2 by FDA under an Emergency Use Authorization (EUA).  This EUA will remain in effect (meaning this test can be used) for the duration of the COVID-19 declaration under Section 564(b)(1) of the Act, 21 U.S.C. section 360bbb-3(b)(1), unless the authorization is terminated or revoked sooner. Performed at [facility], [address] [place], [place] [address]   MRSA PCR Screening     Status: None   Collection Time: 12/08/18  3:29 AM  Result Value Ref Range Status   MRSA by PCR NEGATIVE NEGATIVE Final    Comment:        The GeneXpert MRSA Assay (FDA approved for NASAL specimens only), is one component of a comprehensive MRSA colonization surveillance program. It is not intended to diagnose MRSA infection nor to guide or monitor treatment for MRSA infections. Performed at [facility], [address], [place], [place] [address]      Labs: BNP (last 3 results) Recent Labs    12/07/18 2210  BNP 2,422.0*   Basic Metabolic  Panel: Recent Labs  Lab 12/07/18 2210 12/08/18 0348 12/09/18 0551  NA 136 137 134*  K 4.5 4.4 5.1  CL 101 99 98  CO2 GLUCOSE 124* 109* 116*  BUN 23* 25* 33*  CREATININE 0.95 1.29* 1.08  CALCIUM 8.4* 8.5* 8.3*   Liver Function Tests: Recent Labs  Lab 12/07/18 2210  AST 75*  ALT 157*  ALKPHOS 91  BILITOT 1.7*  PROT 8.0  ALBUMIN 3.5   Recent Labs  Lab 12/07/18 2210  LIPASE 28   No results for input(s): AMMONIA in the last 168 hours. CBC: Recent Labs  Lab 12/07/18 2210 12/08/18 0348  WBC 9.8 9.5  NEUTROABS 5.8  --   HGB 13.6 13.6  HCT 42.8 43.2  MCV 89.5 90.6  PLT 358 367   Cardiac Enzymes: Recent Labs  Lab 12/07/18 2210 12/08/18 0348 12/08/18 1004 12/08/18 1507  TROPONINI 0.03* 0.03* 0.03* 0.03*   BNP: Invalid input(s): POCBNP CBG: No results for input(s): GLUCAP in the last 168 hours. D-Dimer Recent Labs    12/07/18 2248  DDIMER 3.07*   Hgb A1c No results for input(s): HGBA1C in the last 72 hours. Lipid Profile No results for input(s): CHOL, HDL, LDLCALC, TRIG, CHOLHDL, LDLDIRECT in the last 72 hours. Thyroid function studies No results for input(s): TSH, T4TOTAL, T3FREE, THYROIDAB in the last 72 hours.  Invalid input(s): FREET3 Anemia work up No results for input(s): VITAMINB12, FOLATE, FERRITIN, TIBC, IRON, RETICCTPCT in the last 72 hours. Urinalysis    Component Value Date/Time   COLORURINE YELLOW 12/08/2018 0006   APPEARANCEUR CLEAR 12/08/2018 0006   LABSPEC 1.015 12/08/2018 0006   PHURINE 5.0 12/08/2018 0006   GLUCOSEU NEGATIVE 12/08/2018 0006   HGBUR NEGATIVE 12/08/2018 0006   BILIRUBINUR NEGATIVE 12/08/2018 0006   KETONESUR NEGATIVE 12/08/2018 0006   PROTEINUR 30 (A) 12/08/2018 0006   NITRITE NEGATIVE 12/08/2018 0006   LEUKOCYTESUR NEGATIVE 12/08/2018 0006   Sepsis Labs Invalid input(s): PROCALCITONIN,  WBC,  LACTICIDVEN Microbiology Recent Results (from the past 240 hour(s))  SARS Coronavirus 2 (CEPHEID -  Performed in [facility] hospital lab), Hosp Order     Status: None   Collection Time: 12/07/18 11:02 PM  Result Value Ref Range Status   SARS Coronavirus 2 NEGATIVE NEGATIVE Final    Comment: (NOTE) If result is NEGATIVE SARS-CoV-2 target nucleic acids are NOT DETECTED. The SARS-CoV-2 RNA is generally detectable in upper and lower  respiratory specimens during the acute phase of infection. The lowest  concentration of SARS-CoV-2 viral copies this assay can detect is 250  copies / mL. A negative result does not preclude SARS-CoV-2 infection  and should not be used as the sole basis for treatment or other  patient management decisions.  A negative result may occur with  improper specimen collection / handling, submission of specimen other  than nasopharyngeal swab, presence of viral mutation(s) within the  areas targeted by this assay, and inadequate number of viral copies  (<250 copies / mL). A negative result must be combined with clinical  observations, patient history, and epidemiological information. If result is POSITIVE SARS-CoV-2 target nucleic acids are DETECTED. The SARS-CoV-2 RNA is generally detectable in upper and lower  respiratory specimens dur ing the acute phase of infection.  Positive  results are indicative of active infection with SARS-CoV-2.  Clinical  correlation with patient history and other diagnostic information is  necessary to determine patient infection status.  Positive results do  not rule out bacterial infection or co-infection with other  viruses. If result is PRESUMPTIVE POSTIVE SARS-CoV-2 nucleic acids MAY BE PRESENT.   A presumptive positive result was obtained on the submitted specimen  and confirmed on repeat testing.  While 2019 novel coronavirus  (SARS-CoV-2) nucleic acids may be present in the submitted sample  additional confirmatory testing may be necessary for epidemiological  and / or clinical management purposes  to differentiate between   SARS-CoV-2 and other Sarbecovirus currently known to infect humans.  If clinically indicated additional testing with an alternate test  methodology [phone]) is advised. The SARS-CoV-2 RNA is generally  detectable in upper and lower respiratory sp ecimens during the acute  phase of infection. The expected result is Negative. Fact Sheet for Patients:  BoilerBrush.com.cy Fact Sheet for Healthcare Providers: https://pope.com/ This test is not yet approved or cleared by the [place] FDA and has been authorized for detection and/or diagnosis of SARS-CoV-2 by FDA under an Emergency Use Authorization (EUA).  This EUA will remain in effect (meaning this test can be used) for the duration of the COVID-19 declaration under Section 564(b)(1) of the Act, 21 U.S.C. section 360bbb-3(b)(1), unless the authorization is terminated or revoked sooner. Performed at [facility], [address], [place], [place] [address]   MRSA PCR Screening     Status: None   Collection Time: 12/08/18  3:29 AM  Result Value Ref Range Status   MRSA by PCR NEGATIVE NEGATIVE Final    Comment:        The GeneXpert MRSA Assay (FDA approved for NASAL specimens only), is one component of a comprehensive MRSA colonization surveillance program. It is not intended to diagnose MRSA infection nor to guide or monitor treatment for MRSA infections. Performed at [facility], [address], [place], [place] [address]    Time coordinating discharge: 33 mins  SIGNED:  Standley Dakins, MD  [facility] 12/09/2018, 9:29 AM How to contact the [facility] Attending or Consulting provider 7A - 7P or covering provider during after hours 7P -7A, for this patient?  1. Check the care team in [facility] and look for a) attending/consulting [facility] provider listed and b) the [facility] team listed 2. Log into [email or web address] and use [facility]'s universal password to access. If you do not have the  password, please contact the hospital operator. 3. Locate the [facility] provider you are looking for under [facility] and page to a number that you can be directly reached. 4. If you still have difficulty reaching the provider, please page the [facility] (Director on Call) for the Hospitalists listed on amion for assistance.

## 2019-01-09 ENCOUNTER — Ambulatory Visit: Payer: Self-pay | Admitting: Cardiology

## 2019-01-09 NOTE — Progress Notes (Deleted)
     Clinical Summary [name] is a 48 y.o.male   1. Chronic systolic HF -12/1441 limited echo [facility] LVEF 25-30%. Looks to have been a new diagnosis at that time during an admission at [facility] - admission 11/2018 with CHF, diursed and discharged.      2. EtOH abuse   Past Medical History:  Diagnosis Date  . Anxiety   . Depression   . Hypertension      Allergies  Allergen Reactions  . Penicillins Hives and Itching     Current Outpatient Medications  Medication Sig Dispense Refill  . carvedilol (COREG) 6.25 MG tablet Take 1 tablet (6.25 mg total) by mouth 2 (two) times daily with a meal for 30 days. 60 tablet 0  . furosemide (LASIX) 40 MG tablet Take 1 tablet (40 mg total) by mouth daily for 30 days. 30 tablet 0  . lisinopril (ZESTRIL) 2.5 MG tablet Take 1 tablet (2.5 mg total) by mouth daily for 30 days. 30 tablet 0  . potassium chloride (K-DUR) 10 MEQ tablet Take 1 tablet (10 mEq total) by mouth daily for 30 days. 30 tablet 0  . traZODone (DESYREL) 50 MG tablet Take 1 tablet (50 mg total) by mouth at bedtime as needed for sleep. 30 tablet 0   No current facility-administered medications for this visit.      Past Surgical History:  Procedure Laterality Date  . Steel plate put in Frontal lob       Allergies  Allergen Reactions  . Penicillins Hives and Itching      Family History  Problem Relation Age of Onset  . Hypertension Father   . Hypertension Sister   . Hypertension Brother      Social History Mr. Covin reports that he has been smoking cigarettes. He has been smoking about 0.25 packs per day. He has quit using smokeless tobacco. Mr. Laverdure reports previous alcohol use.   Review of Systems CONSTITUTIONAL: No weight loss, fever, chills, weakness or fatigue.  HEENT: Eyes: No visual loss, blurred vision, double vision or yellow sclerae.No hearing loss, sneezing, congestion, runny nose or sore throat.  SKIN: No rash or itching.   CARDIOVASCULAR:  RESPIRATORY: No shortness of breath, cough or sputum.  GASTROINTESTINAL: No anorexia, nausea, vomiting or diarrhea. No abdominal pain or blood.  GENITOURINARY: No burning on urination, no polyuria NEUROLOGICAL: No headache, dizziness, syncope, paralysis, ataxia, numbness or tingling in the extremities. No change in bowel or bladder control.  MUSCULOSKELETAL: No muscle, back pain, joint pain or stiffness.  LYMPHATICS: No enlarged nodes. No history of splenectomy.  PSYCHIATRIC: No history of depression or anxiety.  ENDOCRINOLOGIC: No reports of sweating, cold or heat intolerance. No polyuria or polydipsia.  Marland Kitchen   Physical Examination There were no vitals filed for this visit. There were no vitals filed for this visit.  Gen: resting comfortably, no acute distress HEENT: no scleral icterus, pupils equal round and reactive, no palptable cervical adenopathy,  CV Resp: Clear to auscultation bilaterally GI: abdomen is soft, non-tender, non-distended, normal bowel sounds, no hepatosplenomegaly MSK: extremities are warm, no edema.  Skin: warm, no rash Neuro:  no focal deficits Psych: appropriate affect   Diagnostic Studies     Assessment and Plan        Arnoldo Lenis, M.D., F.A.C.C.

## 2020-04-01 IMAGING — CR PORTABLE CHEST - 1 VIEW
1 series · 1 of 1 positions shown · non-contrast
Comparison: 10/25/2018

CLINICAL DATA: Shortness of breath

EXAM:
PORTABLE CHEST 1 VIEW

[portable]
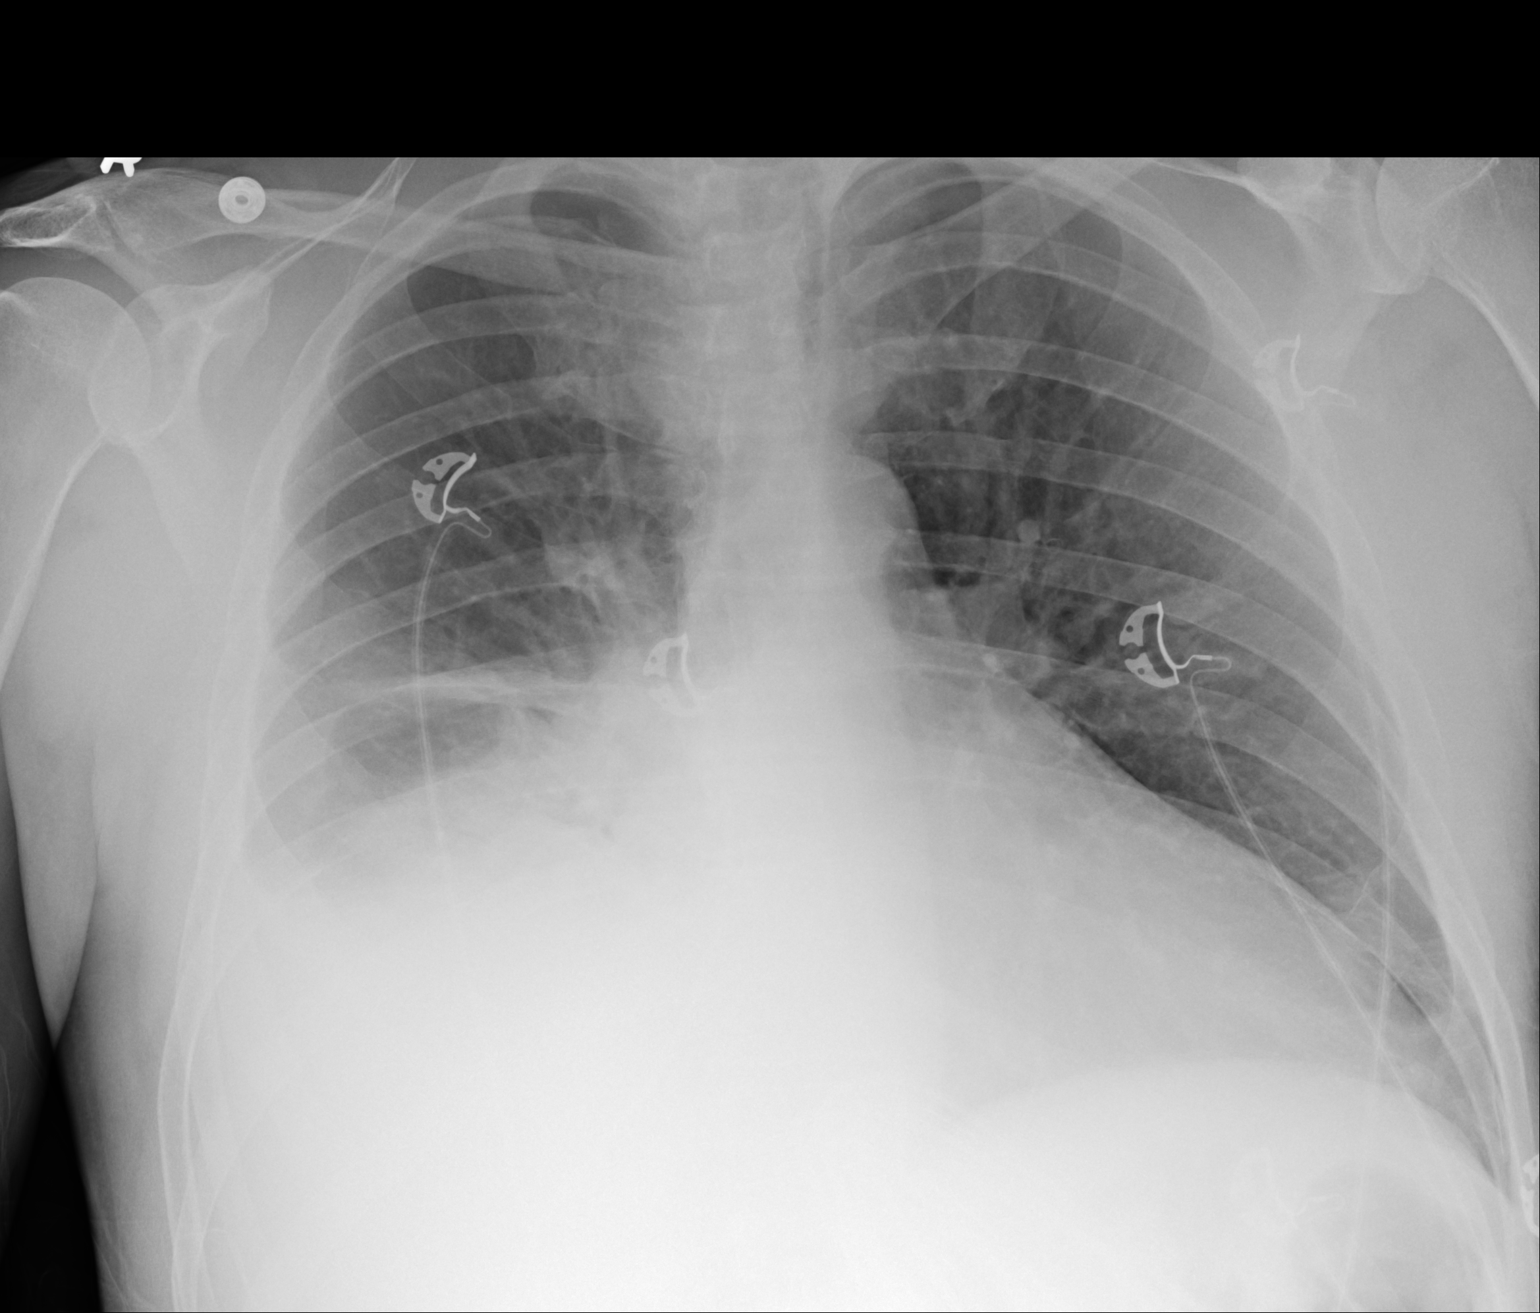

[1 of 1 positions shown; findings below may reference images not displayed]

FINDINGS: Cardiac shadow is enlarged but stable. Resolution of left-sided
pleural effusion and atelectasis is noted. New right-sided pleural
effusion and atelectasis is seen. No other focal abnormality is
noted.
IMPRESSION: Right basilar atelectasis and effusion.

## 2020-04-02 IMAGING — CR PORTABLE CHEST - 1 VIEW
1 series · 1 of 1 positions shown · non-contrast
Comparison: CT 12/07/2018.  Chest x-ray 12/07/2018.

CLINICAL DATA: Bilateral leg swelling.  Shortness of breath.

EXAM:
PORTABLE CHEST 1 VIEW

[portable]
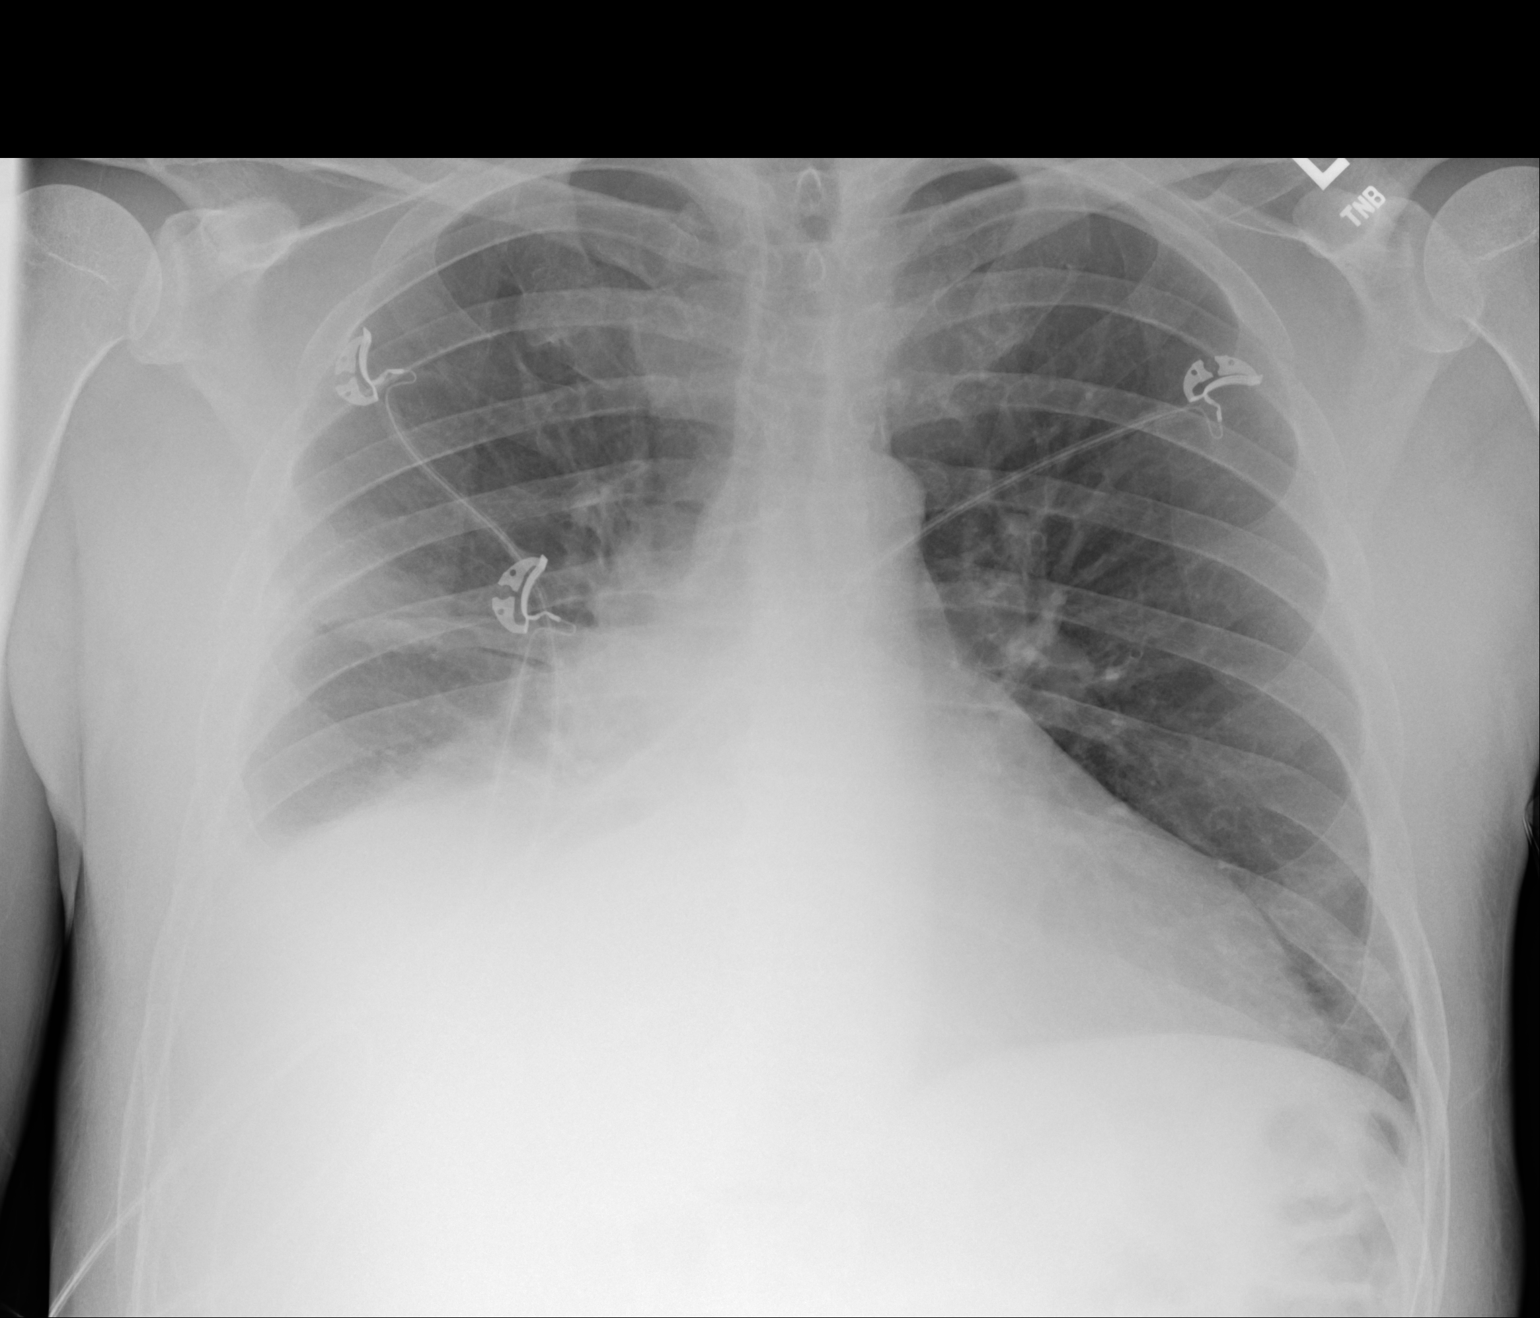

[1 of 1 positions shown; findings below may reference images not displayed]

FINDINGS: Mediastinum hilar structures normal. Cardiomegaly with normal
pulmonary vascularity. Right lung base atelectasis/infiltrate and
moderate right-sided pleural effusion. Similar findings noted on
prior exams. No pneumothorax. No acute bony abnormality.
IMPRESSION: 1.  Stable cardiomegaly.  Normal pulmonary vascularity.

2. Persistent right lung base atelectasis/infiltrate and moderate
right-sided pleural effusion. Similar findings noted on prior exam.

## 2020-04-02 IMAGING — CT CT ABDOMEN AND PELVIS WITH CONTRAST
2 of 5 series · 15 of 46 positions shown, 17 images · IV contrast (Isovue)
Comparison: CT angio chest 12/08/2018

CLINICAL DATA: Increasing shortness of breath, right-sided
abdominal pain and swelling

EXAM:
CT ABDOMEN AND PELVIS WITH CONTRAST
TECHNIQUE: Multidetector CT imaging of the abdomen and pelvis was performed
using the standard protocol following bolus administration of
intravenous contrast.
CONTRAST:  50mL OMNIPAQUE IOHEXOL 300 MG/ML  SOLN

[Series 2: axial st · axial · 0.88mm/px · z∈[+712,+1057]mm · 12 of 83 slices shown, 14 images]
[im 7/83  soft-tissue]
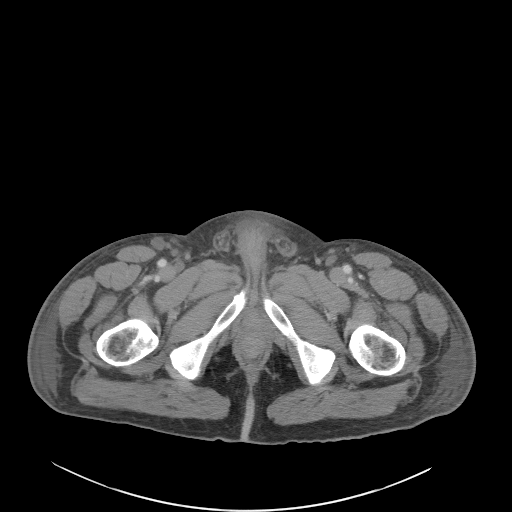
[im 7/83  bone]
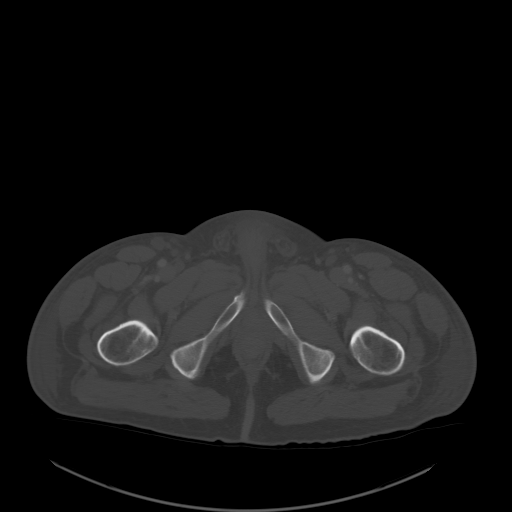
[im 13/83  soft-tissue]
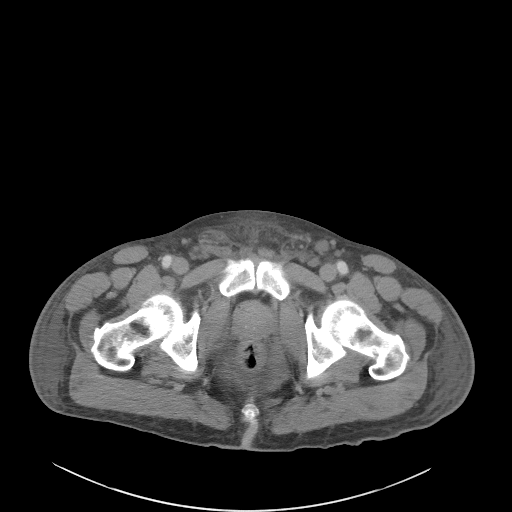
[im 19/83  soft-tissue]
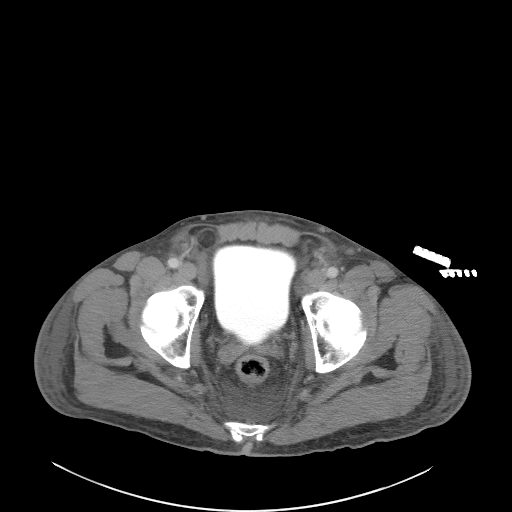
[im 26/83  soft-tissue]
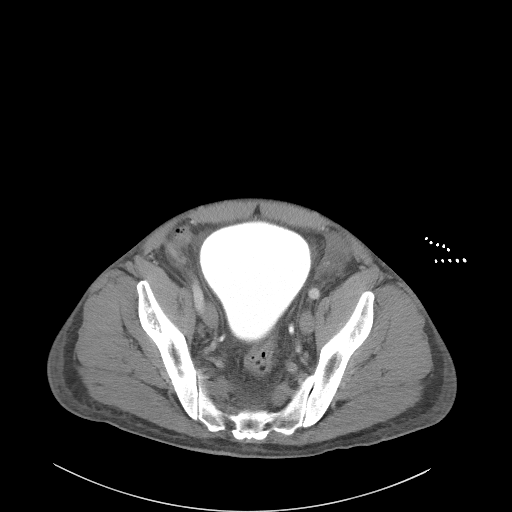
[im 32/83  soft-tissue]
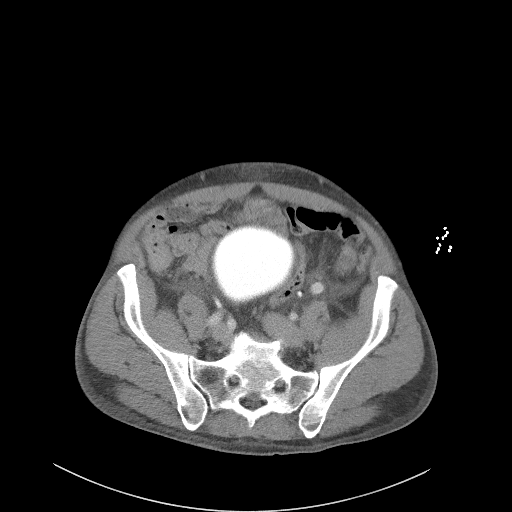
[im 38/83  soft-tissue]
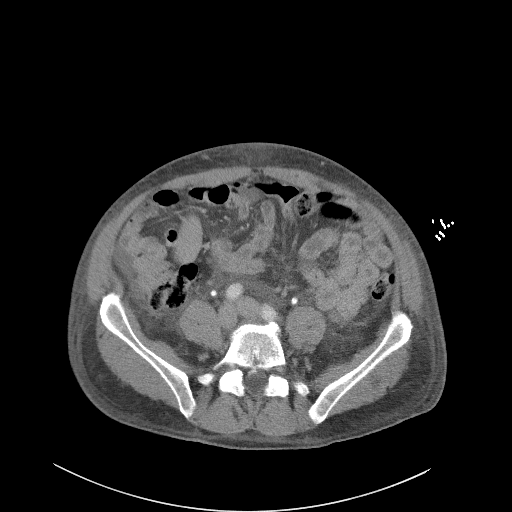
[im 45/83  soft-tissue]
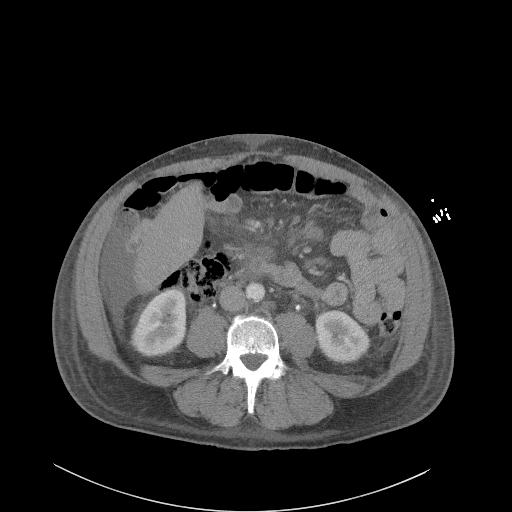
[im 51/83  soft-tissue]
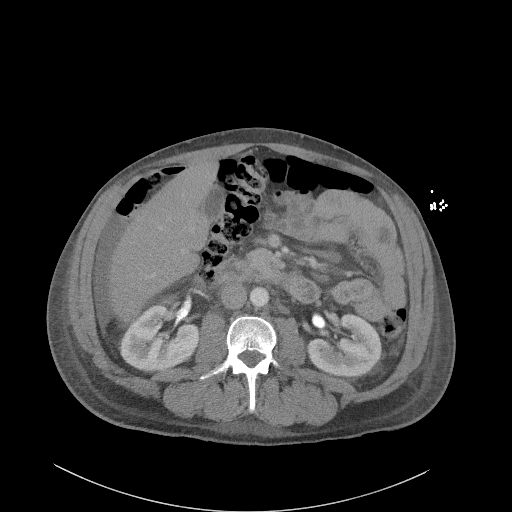
[im 57/83  soft-tissue]
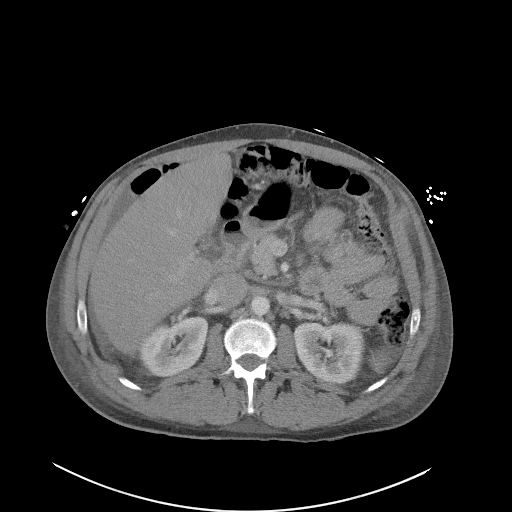
[im 57/83  bone]
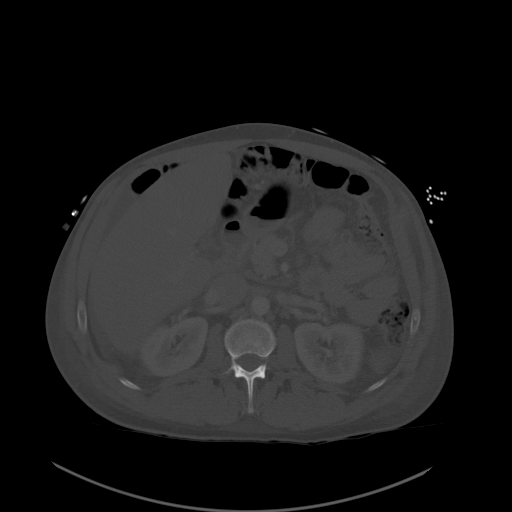
[im 64/83  soft-tissue]
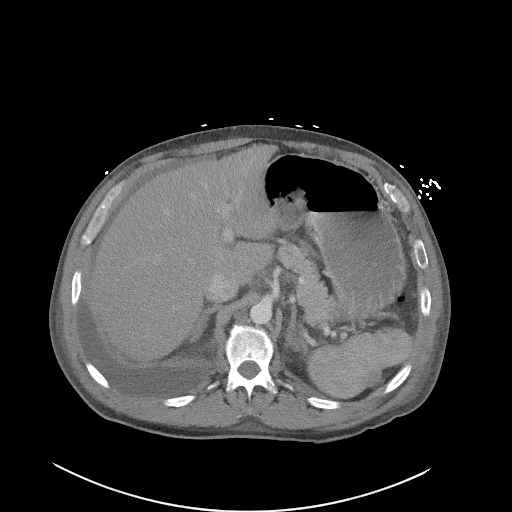
[im 70/83  soft-tissue]
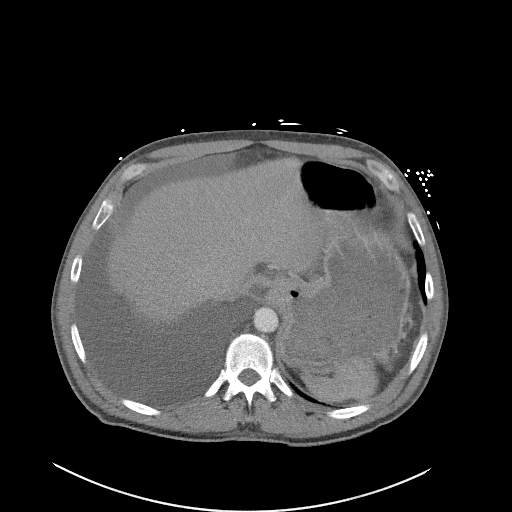
[im 76/83  soft-tissue]
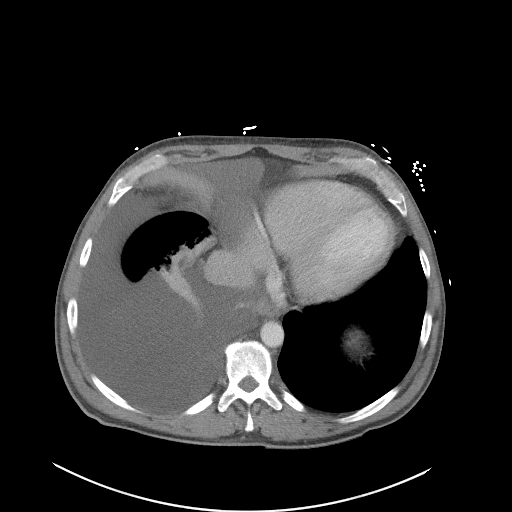

[Series 5: coronal st · coronal · 0.72mm/px · 3 of 94 slices shown]
[im 32/94  soft-tissue]
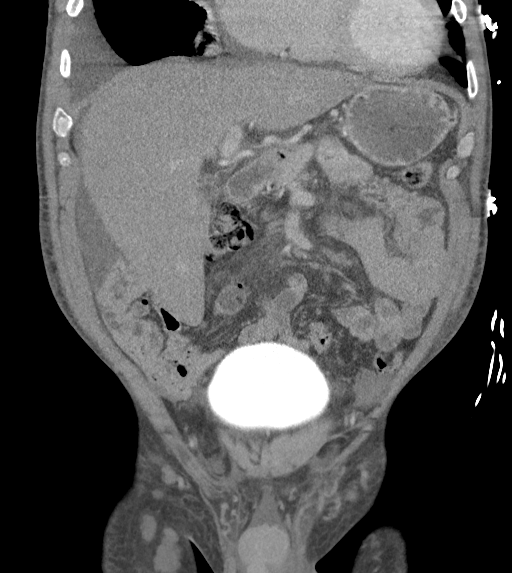
[im 42/94  soft-tissue]
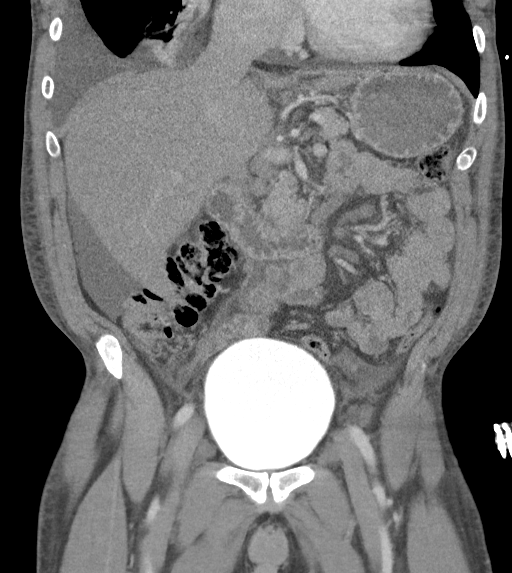
[im 52/94  soft-tissue]
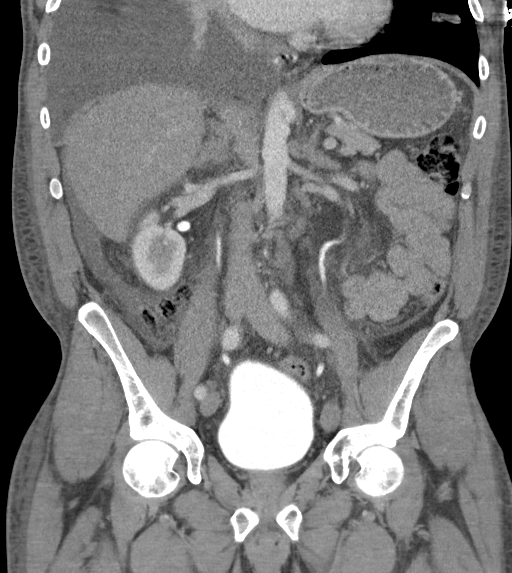

[15 of 46 positions shown; findings below may reference images not displayed]

FINDINGS: Lower chest: Moderate right pleural effusion with right basilar
atelectasis. Stable cardiomegaly. 2.2 cm fluid attenuating nodular
area along the left major fissure likely reflecting loculated
pleural fluid. 12 mm left lower lobe subpleural pulmonary nodule.

Hepatobiliary: 2.2 cm fluid attenuating nodular area along the left
major fissure likely reflecting loculated pleural fluid. 12 mm left
lower lobe subpleural pulmonary nodule.

Pancreas: Unremarkable. No pancreatic ductal dilatation or
surrounding inflammatory changes.

Spleen: Normal in size without focal abnormality.

Adrenals/Urinary Tract: Adrenal glands are unremarkable. Kidneys are
normal, without renal calculi, focal lesion, or hydronephrosis.
Bladder is unremarkable.

Stomach/Bowel: Stomach is within normal limits. Appendix appears
normal. No evidence of bowel wall thickening, distention, or
inflammatory changes.

Vascular/Lymphatic: No significant vascular findings are present. No
enlarged abdominal or pelvic lymph nodes.

Reproductive: Prostate is unremarkable.

Other: No abdominal wall hernia or abnormality. Small amount of
pelvic free fluid. Generalized anasarca.

Musculoskeletal: No acute osseous abnormality. No aggressive osseous
lesion.
IMPRESSION: 1. Moderate right pleural effusion with compressive atelectasis.
2. Small amount of abdominal and pelvic free fluid of uncertain
etiology.
3. Anasarca.
4. 10 mm subpleural left lower lobe pulmonary nodule. Recommend
follow-up CT of the chest in 3 months. This recommendation follows
the consensus statement: Guidelines for Management of Incidental
Pulmonary Nodules Detected on CT Images: From the [HOSPITAL]
